# Patient Record
Sex: Male | Born: 1990 | Race: White | Hispanic: No | Marital: Single | Smoking: Current every day smoker
Health system: Southern US, Community
[De-identification: ages and names within clinical notes are randomized; demographics above are authoritative.]

## PROBLEM LIST (undated history)

## (undated) DIAGNOSIS — F1910 Other psychoactive substance abuse, uncomplicated: Secondary | ICD-10-CM

## (undated) DIAGNOSIS — R17 Unspecified jaundice: Secondary | ICD-10-CM

## (undated) DIAGNOSIS — R599 Enlarged lymph nodes, unspecified: Secondary | ICD-10-CM

## (undated) HISTORY — DX: Enlarged lymph nodes, unspecified: R59.9

## (undated) HISTORY — PX: TONSILLECTOMY: SUR1361

---
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
## 2012-08-23 DIAGNOSIS — R599 Enlarged lymph nodes, unspecified: Secondary | ICD-10-CM

## 2012-08-23 HISTORY — DX: Enlarged lymph nodes, unspecified: R59.9

## 2012-10-31 ENCOUNTER — Ambulatory Visit: Payer: Self-pay | Admitting: Family Medicine

## 2013-02-06 ENCOUNTER — Ambulatory Visit (INDEPENDENT_AMBULATORY_CARE_PROVIDER_SITE_OTHER): Payer: BC Managed Care – PPO | Admitting: General Surgery

## 2013-02-06 ENCOUNTER — Encounter: Payer: Self-pay | Admitting: General Surgery

## 2013-02-06 VITALS — BP 120/58 | HR 80 | Resp 12 | Ht 70.0 in | Wt 153.0 lb

## 2013-02-06 DIAGNOSIS — R599 Enlarged lymph nodes, unspecified: Secondary | ICD-10-CM

## 2013-02-06 NOTE — Progress Notes (Signed)
Patient ID: Joel [name], male   DOB: [birth year], 22 y.o.   MRN: [id number]  Chief Complaint  Patient presents with  . Other    evaluation of left groin lymph node    HPI Joel [name] is a 22 y.o. male here for an evaluation of left groin lymph node. He states he has two lumps bilaterally. He states he noticed this approximately three  months ago. He first noted 2 lumps in left groin. These have not resolved. Has completed a course of Augmentin and a course of Doxycycline. Denies any night sweats, fever, fatigue. HPI  Past Medical History  Diagnosis Date  . Enlarged lymph node 2014    Past Surgical History  Procedure Laterality Date  . Tonsillectomy      History reviewed. No pertinent family history.  Social History History  Substance Use Topics  . Smoking status: Current Every Day Smoker -- 1.00 packs/day for 4 years  . Smokeless tobacco: Never Used  . Alcohol Use: Yes    No Known Allergies  Current Outpatient Prescriptions  Medication Sig Dispense Refill  . amoxicillin-clavulanate (AUGMENTIN) 875-125 MG per tablet Take 1 tablet by mouth 2 (two) times daily.      . diclofenac (VOLTAREN) 75 MG EC tablet Take 75 mg by mouth 2 (two) times daily.      Marland Kitchen doxycycline (VIBRA-TABS) 100 MG tablet Take 1 tablet by mouth 2 (two) times daily.      . meloxicam (MOBIC) 15 MG tablet Take 1 tablet by mouth daily.       No current facility-administered medications for this visit.    Review of Systems Review of Systems  Constitutional: Negative.   Respiratory: Negative.   Cardiovascular: Negative.     Blood pressure 120/58, pulse 80, resp. rate 12, height 5\' 10"  (1.778 m), weight 153 lb (69.4 kg).  Physical Exam Physical Exam  Constitutional: He is oriented to person, place, and time. He appears well-developed and well-nourished.  Eyes: Conjunctivae are normal. No scleral icterus.  Neck: Trachea normal. No mass and no thyromegaly present.   Cardiovascular: Normal rate, regular rhythm, normal heart sounds and normal pulses.   No murmur heard. Pulmonary/Chest: Effort normal and breath sounds normal.  Abdominal: Soft. Normal appearance and bowel sounds are normal. There is no hepatosplenomegaly. There is no tenderness. No hernia.  Lymphadenopathy:    He has no cervical adenopathy.    He has axillary adenopathy.       Right axillary: No pectoral and no lateral adenopathy present.       Left axillary: Pectoral adenopathy present. No lateral adenopathy present.      Left: Inguinal adenopathy present.  Tiny mobile nodes in left axilla.  1.5 cm firm rubbery mobile node left groin.  Few shotty nodes on right groin.   Neurological: He is alert and oriented to person, place, and time.  Skin: Skin is warm and dry.    Data Reviewed none  Assessment    Persistent left groin node, firm and rubbery feel.      Plan    Excision with immediate path assessment for touch prep and possible flow studies. Explained fully to pt and he is agreeable.        Joel [name] G 02/06/2013, 6:36 PM

## 2013-02-06 NOTE — Patient Instructions (Addendum)
Patient to return for left groin lymph node excision.

## 2013-02-12 ENCOUNTER — Encounter: Payer: Self-pay | Admitting: General Surgery

## 2013-02-12 ENCOUNTER — Ambulatory Visit (INDEPENDENT_AMBULATORY_CARE_PROVIDER_SITE_OTHER): Payer: BC Managed Care – PPO | Admitting: General Surgery

## 2013-02-12 VITALS — BP 116/70 | HR 78 | Resp 14 | Ht 71.0 in | Wt 148.0 lb

## 2013-02-12 DIAGNOSIS — R599 Enlarged lymph nodes, unspecified: Secondary | ICD-10-CM

## 2013-02-12 NOTE — Patient Instructions (Signed)
Advised on wound care. F/U based on path report. If benign he can return here prn.

## 2013-02-12 NOTE — Progress Notes (Signed)
Patient ID: Joel [name], male   DOB: 1990/12/20, 22 y.o.   MRN: [id number]  Chief Complaint  Patient presents with  . Procedure    left groin node    HPI Joel [name] is a 22 y.o. male. here toda for an excision left groin node. HPI  Past Medical History  Diagnosis Date  . Enlarged lymph node 2014    Past Surgical History  Procedure Laterality Date  . Tonsillectomy      No family history on file.  Social History History  Substance Use Topics  . Smoking status: Current Every Day Smoker -- 1.00 packs/day for 4 years  . Smokeless tobacco: Never Used  . Alcohol Use: Yes    No Known Allergies  Current Outpatient Prescriptions  Medication Sig Dispense Refill  . amoxicillin-clavulanate (AUGMENTIN) 875-125 MG per tablet Take 1 tablet by mouth 2 (two) times daily.      . diclofenac (VOLTAREN) 75 MG EC tablet Take 75 mg by mouth 2 (two) times daily.      Marland Kitchen doxycycline (VIBRA-TABS) 100 MG tablet Take 1 tablet by mouth 2 (two) times daily.      . meloxicam (MOBIC) 15 MG tablet Take 1 tablet by mouth daily.       No current facility-administered medications for this visit.    Review of Systems Review of Systems  Constitutional: Negative.   Respiratory: Negative.   Cardiovascular: Negative.     There were no vitals taken for this visit.  Physical Exam Physical Exam  Data Reviewed none  Assessment    Left groin lymph node enlargement     Plan    Excision completed.     Procedure: Excision left groin node. Anesthetic: 1%xylocaine mixed with 0.5%marcaine 5ml.  Prep: chloroprep.  aincision made transversely over palpable node on left groin after prep. Dissected down and a 1.3cm node was excised in full. Pedicle ligated with 3-0 vicryl. Bleeding points cauterised. Wound closed with 3-0 vicryl and 4-0 vicryl. steristrips applied. Telfa and tegaderm. No immediate problems from procedure. Specimen sent to pathology fresh.   Ples Specter 02/12/2013, 8:17 AM

## 2013-02-14 ENCOUNTER — Telehealth: Payer: Self-pay | Admitting: *Deleted

## 2013-02-14 NOTE — Telephone Encounter (Signed)
Notified patient as instructed, patient pleased. Discussed follow-up appointments as needed basis, patient agrees

## 2013-02-19 LAB — PATHOLOGY

## 2013-04-03 LAB — COMP PANEL: LEUKEMIA/LYMPHOMA

## 2013-06-21 ENCOUNTER — Emergency Department: Payer: Self-pay | Admitting: Emergency Medicine

## 2013-06-21 LAB — CBC
HGB: 15.7 g/dL (ref 13.0–18.0)
MCH: 30 pg (ref 26.0–34.0)
MCHC: 34.2 g/dL (ref 32.0–36.0)
WBC: 9.6 10*3/uL (ref 3.8–10.6)

## 2013-06-21 LAB — COMPREHENSIVE METABOLIC PANEL
BUN: 13 mg/dL (ref 7–18)
Bilirubin,Total: 1.7 mg/dL — ABNORMAL HIGH (ref 0.2–1.0)
Co2: 29 mmol/L (ref 21–32)
Creatinine: 0.93 mg/dL (ref 0.60–1.30)
EGFR (African American): >60
EGFR (Non-African Amer.): >60
Glucose: 86 mg/dL (ref 65–99)
Osmolality: 273 (ref 275–301)
Potassium: 4.1 mmol/L (ref 3.5–5.1)
SGOT(AST): 23 U/L (ref 15–37)
SGPT (ALT): 21 U/L (ref 12–78)
Sodium: 137 mmol/L (ref 136–145)
Total Protein: 7.2 g/dL (ref 6.4–8.2)

## 2013-06-21 LAB — SALICYLATE LEVEL: Salicylates, Serum: <1.7 mg/dL

## 2013-06-21 LAB — TSH: Thyroid Stimulating Horm: 0.24 u[IU]/mL — ABNORMAL LOW

## 2013-06-21 LAB — ETHANOL: Ethanol %: <0.003 % (ref 0.000–0.080)

## 2013-06-22 LAB — DRUG SCREEN, URINE
Amphetamines, Ur Screen: NEGATIVE (ref ?–1000)
Barbiturates, Ur Screen: NEGATIVE (ref ?–200)
Benzodiazepine, Ur Scrn: POSITIVE (ref ?–200)
Cocaine Metabolite,Ur ~~LOC~~: NEGATIVE (ref ?–300)
Opiate, Ur Screen: NEGATIVE (ref ?–300)
Phencyclidine (PCP) Ur S: NEGATIVE (ref ?–25)
Tricyclic, Ur Screen: NEGATIVE (ref ?–1000)

## 2013-06-22 LAB — URINALYSIS, COMPLETE
Bilirubin,UR: NEGATIVE
Ph: 7 (ref 4.5–8.0)
RBC,UR: <1 /HPF (ref 0–5)

## 2013-06-28 ENCOUNTER — Other Ambulatory Visit: Payer: Self-pay

## 2013-07-16 ENCOUNTER — Ambulatory Visit: Payer: Self-pay | Admitting: Psychiatry

## 2013-07-23 ENCOUNTER — Ambulatory Visit: Payer: Self-pay | Admitting: Psychiatry

## 2013-08-23 ENCOUNTER — Ambulatory Visit: Payer: Self-pay | Admitting: Psychiatry

## 2013-09-05 LAB — DRUG SCREEN, URINE
Amphetamines, Ur Screen: NEGATIVE (ref ?–1000)
Barbiturates, Ur Screen: NEGATIVE (ref ?–200)
Benzodiazepine, Ur Scrn: NEGATIVE (ref ?–200)
CANNABINOID 50 NG, UR ~~LOC~~: NEGATIVE (ref ?–50)
Cocaine Metabolite,Ur ~~LOC~~: NEGATIVE (ref ?–300)
MDMA (ECSTASY) UR SCREEN: NEGATIVE (ref ?–500)
Methadone, Ur Screen: NEGATIVE (ref ?–300)
OPIATE, UR SCREEN: NEGATIVE (ref ?–300)
Phencyclidine (PCP) Ur S: NEGATIVE (ref ?–25)
TRICYCLIC, UR SCREEN: NEGATIVE (ref ?–1000)

## 2013-09-23 ENCOUNTER — Ambulatory Visit: Payer: Self-pay | Admitting: Psychiatry

## 2014-05-02 ENCOUNTER — Emergency Department: Payer: Self-pay | Admitting: Emergency Medicine

## 2014-05-02 LAB — COMPREHENSIVE METABOLIC PANEL
ALT: 25 U/L
AST: 21 U/L (ref 15–37)
Albumin: 4.3 g/dL (ref 3.4–5.0)
Alkaline Phosphatase: 99 U/L
Anion Gap: 8 (ref 7–16)
BUN: 13 mg/dL (ref 7–18)
Bilirubin,Total: 0.6 mg/dL (ref 0.2–1.0)
CREATININE: 0.96 mg/dL (ref 0.60–1.30)
Calcium, Total: 8.6 mg/dL (ref 8.5–10.1)
Chloride: 103 mmol/L (ref 98–107)
Co2: 28 mmol/L (ref 21–32)
EGFR (African American): >60
EGFR (Non-African Amer.): >60
GLUCOSE: 127 mg/dL — AB (ref 65–99)
OSMOLALITY: 279 (ref 275–301)
Potassium: 3.5 mmol/L (ref 3.5–5.1)
Sodium: 139 mmol/L (ref 136–145)
Total Protein: 7.5 g/dL (ref 6.4–8.2)

## 2014-05-02 LAB — CBC
HCT: 43.6 % (ref 40.0–52.0)
HGB: 14.5 g/dL (ref 13.0–18.0)
MCH: 29.8 pg (ref 26.0–34.0)
MCHC: 33.2 g/dL (ref 32.0–36.0)
MCV: 90 fL (ref 80–100)
Platelet: 293 10*3/uL (ref 150–440)
RBC: 4.86 10*6/uL (ref 4.40–5.90)
RDW: 12.7 % (ref 11.5–14.5)
WBC: 17.1 10*3/uL — AB (ref 3.8–10.6)

## 2014-05-02 LAB — URINALYSIS, COMPLETE
BILIRUBIN, UR: NEGATIVE
Bacteria: NONE SEEN
Blood: NEGATIVE
Glucose,UR: NEGATIVE mg/dL (ref 0–75)
Ketone: NEGATIVE
Nitrite: NEGATIVE
PROTEIN: NEGATIVE
Ph: 6 (ref 4.5–8.0)
RBC, UR: NONE SEEN /HPF (ref 0–5)
Specific Gravity: 1.009 (ref 1.003–1.030)
Squamous Epithelial: NONE SEEN

## 2014-05-02 LAB — DRUG SCREEN, URINE
AMPHETAMINES, UR SCREEN: NEGATIVE (ref ?–1000)
Barbiturates, Ur Screen: NEGATIVE (ref ?–200)
Benzodiazepine, Ur Scrn: NEGATIVE (ref ?–200)
CANNABINOID 50 NG, UR ~~LOC~~: NEGATIVE (ref ?–50)
Cocaine Metabolite,Ur ~~LOC~~: NEGATIVE (ref ?–300)
MDMA (Ecstasy)Ur Screen: NEGATIVE (ref ?–500)
Methadone, Ur Screen: NEGATIVE (ref ?–300)
Opiate, Ur Screen: POSITIVE (ref ?–300)
Phencyclidine (PCP) Ur S: NEGATIVE (ref ?–25)
Tricyclic, Ur Screen: NEGATIVE (ref ?–1000)

## 2014-05-02 LAB — ACETAMINOPHEN LEVEL: Acetaminophen: <2 ug/mL

## 2014-05-02 LAB — SALICYLATE LEVEL

## 2014-05-02 LAB — ETHANOL

## 2014-08-07 ENCOUNTER — Emergency Department: Payer: Self-pay | Admitting: Emergency Medicine
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
## 2014-12-13 NOTE — Consult Note (Signed)
Consult: treatment recommendations Patient was seen as requested by Staff. He was oriented to the [facility] CD-IOP however, he indicated plans to seek assistance to help with abstinence of all addictive substances of abuse usage thru the services of a residential center and that upon his return to his home he would like to participate in the CD-IOP. He believes that he could benefit from CD-IOP. Plans are to follow CD-IOP at his residential treatment discharge.    Electronic Signatures: Huel Cotehomas, Richard (PsyD)  (Signed on 04-Nov-14 17:15)  Authored  Last Updated: 04-Nov-14 17:15 by Huel Cotehomas, Richard (PsyD)

## 2014-12-13 NOTE — Consult Note (Signed)
Brief Consult Note: Diagnosis: Polysubstance dependence, substance induced mood disorder.   Patient was seen by consultant.   Consult note dictated.   Recommend further assessment or treatment.   Orders entered.   Comments: [name] has a h/o benzodiazepine and opioid dependence here after a suicide threat in the context of substance use. he is on Librium taper for benzodiazepine dependence. There are no symptoms of opioid withdrawal.    PLAN: 1. He is on IVC.  2. Referreal to [facility] completed.  3. Will continue Librium taper to avoid symptoms of benzodiazepine withdrawal. Symptomatic treatment for opiod withdrawal will be offered.  4. Psychiatry will follow.  Electronic Signatures: Joel [name], Joel [name] (MD)  (Signed [id number]-Nov-14 20:31)  Authored: Brief Consult Note   Last Updated: 03-Nov-14 20:31 by Joel [name], Joel [name] (MD)

## 2014-12-13 NOTE — Consult Note (Signed)
Brief Consult Note: Diagnosis: Polysubstance dependence, substance induced mood disorder.   Patient was seen by consultant.   Consult note dictated.   Recommend further assessment or treatment.   Orders entered.   Comments: Mr. Malen GauzeFoster has a h/o benzodiazepine and opioid dependence here after a suicide threat in the context of substance use.   PLAN: 1. He is on IVC.  2. Referreal to [facility] completed.  3. Will start low dose Librium to avoid symptoms of benzodiazepine withdrawal. Symptomatic treatment for opiod withdrawal will be offered.  4. Psychiatry will follow.  Electronic Signatures: Kristine LineaPucilowska, Myiah Petkus (MD)  (Signed 31-Oct-14 19:34)  Authored: Brief Consult Note   Last Updated: 31-Oct-14 19:34 by Kristine LineaPucilowska, Marzell Isakson (MD)

## 2014-12-14 NOTE — Consult Note (Signed)
PATIENT NAME:  Joel [name], Joel [name] MR#:  [id number] DATE OF BIRTH:  Sep 01, 1990  DATE OF CONSULTATION:  05/02/2014  CONSULTING PHYSICIAN: Audery Amel, M.D.   IDENTIFYING INFORMATION AND REASON FOR CONSULT: This is a 24 year old man who presented voluntarily to the Emergency Room.   CHIEF COMPLAINT: "I relapsed."   HISTORY OF PRESENT ILLNESS: Information obtained from the patient and the chart. The patient states that he relapsed using intravenous heroin last night. Prior to that he had been clean for about a week and a half. He only started using heroin about a month or so ago. Prior to that he has had an opiate problem for a little over year. He has been able to maintain some sobriety in the past for up to a couple of months. He denies that he is abusing other drugs. Mood has overall been pretty good. Not feeling depressed. Sleeps okay. Denies any suicidal ideation. Denies any psychotic symptoms. Not currently taking any medication. Has support from his family and good insight. Not currently having significant withdrawal symptoms.   PAST PSYCHIATRIC HISTORY: No treatment for depression that he knows of although he says that he has been prescribed citalopram, I am not sure what that is for. No history of suicide attempts, no history of psychosis. Says he has never been in a psychiatric hospital for anything except substance abuse. He has been to [facility] once before and found it helpful, stayed sober for a couple of months afterwards.   SUBSTANCE ABUSE HISTORY: Just started abusing opiates about a year ago. Used oral opiates at first, but now is using IV heroin. Rarely uses any other drugs. He drinks very really, has not developed a problem that reports with any other drugs.   FAMILY HISTORY: Father with substance abuse problems.   SOCIAL HISTORY: The patient works in Designer, fashion/clothing. He lives with his parents. Says they are very supportive and are helpful to him.   REVIEW OF SYSTEMS: Denies pain, nausea,  cardiac symptoms pulmonary symptoms. No depression, no suicidal ideation, no hallucinations. Full review of systems negative.   MENTAL STATUS EXAMINATION: Neatly groomed young man who looks his stated age, cooperative with the interview. Good eye contact. Normal psychomotor activity. Speech normal rate, tone, and volume. Affect euthymic, reactive, appropriate. Mood stated as good. Thoughts lucid. No loosening of associations or delusions. Denies auditory or visual hallucinations. Denies suicidal or homicidal ideation. Shows good insight and judgment. Alert and oriented x 4. Remembers 3 out of 3 objects immediately and at 3 minutes.   LABORATORY RESULTS: His chemistry panel shows an elevated glucose at 127 on a non-fasting draw. The drug screen is positive for opiates. Hematology panel, elevated white count 17.1, otherwise unremarkable. Urinalysis unremarkable.   VITAL SIGNS: Blood pressure most recently 117/59, respirations 18, pulse 81, temperature 97.4.   ASSESSMENT: A 24 year old man with opiate dependence. Not having significant withdrawal symptoms. No suicidal ideation, no psychosis, good insight into his condition. He already has a plan to go see an outpatient substance abuse provider tomorrow. At this point there is no indication for further hospital level treatment.   TREATMENT PLAN: He can be released from the Emergency Room. The patient has been counseled about available resources and the possibility of future use of Suboxone discussed. He is encouraged to go home to his parent's house, do not use any drugs, get some rest and tomorrow go to a substance abuse counselor appointment he already has.   DIAGNOSIS PRINCIPAL AND PRIMARY:   AXIS I:  Opiate dependence.   SECONDARY DIAGNOSES:   AXIS I: No further.   AXIS II: No diagnosis.   AXIS III: No diagnosis.    ____________________________ Audery AmelJohn T. Kamalani Mastro, MD jtc:bu D: 05/02/2014 16:23:05 ET T: 05/02/2014 16:55:15  ET JOB#: [id number]  cc: Audery AmelJohn T. Dameisha Tschida, MD, <Dictator> Audery AmelJOHN T Pearlene Teat MD ELECTRONICALLY SIGNED 05/03/2014 17:03

## 2014-12-14 NOTE — Consult Note (Signed)
Brief Consult Note: Diagnosis: opiate dependence.   Patient was seen by consultant.   Consult note dictated.   Discussed with Attending MD.   Comments: Psychiatry: Patient seen and chart reviewed and note dictated. Patient with opiate dep relapsed yesterday. Currently no SI and no psychosis. No need for inpatient treatment. Going to outpt ttreatment tomorrow. RElease from ER.  Electronic Signatures: Alezander Dimaano, Jackquline DenmarkJohn T (MD)  (Signed 10-Sep-15 16:18)  Authored: Brief Consult Note   Last Updated: 10-Sep-15 16:18 by Audery Amellapacs, Andreia Gandolfi T (MD)

## 2014-12-16 ENCOUNTER — Emergency Department (HOSPITAL_COMMUNITY)
Admission: EM | Admit: 2014-12-16 | Discharge: 2014-12-17 | Disposition: A | Discharge status: Home / Self Care | Payer: BLUE CROSS/BLUE SHIELD | Attending: Emergency Medicine | Admitting: Emergency Medicine

## 2014-12-16 ENCOUNTER — Encounter (HOSPITAL_COMMUNITY): Payer: Self-pay | Admitting: Emergency Medicine

## 2014-12-16 DIAGNOSIS — F1410 Cocaine abuse, uncomplicated: Secondary | ICD-10-CM | POA: Insufficient documentation

## 2014-12-16 DIAGNOSIS — Z79891 Long term (current) use of opiate analgesic: Secondary | ICD-10-CM | POA: Insufficient documentation

## 2014-12-16 DIAGNOSIS — Z79899 Other long term (current) drug therapy: Secondary | ICD-10-CM | POA: Diagnosis not present

## 2014-12-16 DIAGNOSIS — F13129 Sedative, hypnotic or anxiolytic abuse with intoxication, unspecified: Secondary | ICD-10-CM | POA: Diagnosis not present

## 2014-12-16 DIAGNOSIS — F12129 Cannabis abuse with intoxication, unspecified: Secondary | ICD-10-CM | POA: Insufficient documentation

## 2014-12-16 DIAGNOSIS — Z720 Tobacco use: Secondary | ICD-10-CM | POA: Diagnosis not present

## 2014-12-16 DIAGNOSIS — F1110 Opioid abuse, uncomplicated: Secondary | ICD-10-CM

## 2014-12-16 DIAGNOSIS — F11120 Opioid abuse with intoxication, uncomplicated: Secondary | ICD-10-CM | POA: Insufficient documentation

## 2014-12-16 LAB — CBC WITH DIFFERENTIAL/PLATELET
BASOS PCT: 0 % (ref 0–1)
Basophils Absolute: 0 10*3/uL (ref 0.0–0.1)
EOS PCT: 1 % (ref 0–5)
Eosinophils Absolute: 0.2 10*3/uL (ref 0.0–0.7)
HCT: 41.2 % (ref 39.0–52.0)
Hemoglobin: 14.3 g/dL (ref 13.0–17.0)
LYMPHS ABS: 2.9 10*3/uL (ref 0.7–4.0)
LYMPHS PCT: 21 % (ref 12–46)
MCH: 30.2 pg (ref 26.0–34.0)
MCHC: 34.7 g/dL (ref 30.0–36.0)
MCV: 87.1 fL (ref 78.0–100.0)
Monocytes Absolute: 1 10*3/uL (ref 0.1–1.0)
Monocytes Relative: 8 % (ref 3–12)
Neutro Abs: 9.5 10*3/uL — ABNORMAL HIGH (ref 1.7–7.7)
Neutrophils Relative %: 70 % (ref 43–77)
Platelets: 293 10*3/uL (ref 150–400)
RBC: 4.73 MIL/uL (ref 4.22–5.81)
RDW: 12.8 % (ref 11.5–15.5)
WBC: 13.6 10*3/uL — ABNORMAL HIGH (ref 4.0–10.5)

## 2014-12-16 LAB — BASIC METABOLIC PANEL
Anion gap: 12 (ref 5–15)
BUN: 12 mg/dL (ref 6–23)
CO2: 26 mmol/L (ref 19–32)
CREATININE: 1.09 mg/dL (ref 0.50–1.35)
Calcium: 9 mg/dL (ref 8.4–10.5)
Chloride: 100 mmol/L (ref 96–112)
GFR calc Af Amer: >90 mL/min (ref >90)
Glucose, Bld: 116 mg/dL — ABNORMAL HIGH (ref 70–99)
Potassium: 3.6 mmol/L (ref 3.5–5.1)
SODIUM: 138 mmol/L (ref 135–145)

## 2014-12-16 NOTE — ED Notes (Signed)
Pt's name called Joel [name].

## 2014-12-16 NOTE — ED Notes (Signed)
Pt reports he never left the waiting room, discharge undone.

## 2014-12-16 NOTE — ED Provider Notes (Signed)
CSN: [id number]     Arrival date & time 12/16/14  1646 History   First MD Initiated Contact with Patient 12/16/14 2242     Chief Complaint  Patient presents with  . Drug Problem     (Consider location/radiation/quality/duration/timing/severity/associated sxs/prior Treatment) HPI Joel [name] is a 24 y.o. male with no medical problems, presents to ED requesting detox. Pt states he has been using opiates all his life. He has been to detox a year ago but relapsed approximately 6 months ago. He takes pills and does heroin. Last [name] heroin was about 2 hours ago. He states he tried to stop on his own but gets bad withdrawal symptoms. He is requesting inpatient detox and inpatient rehabilitation. He denies any SI or HI. He does not drink alcohol. Admits to cocaine and marijuana use. States he uses pens as sometimes as well. Denies any medical problems or complaints.    Past Medical History  Diagnosis Date  . Enlarged lymph node 2014   Past Surgical History  Procedure Laterality Date  . Tonsillectomy     History reviewed. No pertinent family history. History  Substance Use Topics  . Smoking status: Current Every Day Smoker -- 1.00 packs/day for 4 years    Types: Cigarettes  . Smokeless tobacco: Never Used  . Alcohol Use: Yes    Review of Systems  Constitutional: Negative for fever and chills.  Respiratory: Negative for cough, chest tightness and shortness of breath.   Cardiovascular: Negative for chest pain, palpitations and leg swelling.  Gastrointestinal: Negative for nausea, vomiting, abdominal pain, diarrhea and abdominal distention.  Musculoskeletal: Negative for myalgias, neck pain and neck stiffness.  Skin: Negative for rash.  Allergic/Immunologic: Negative for immunocompromised state.  Neurological: Negative for dizziness, weakness, light-headedness, numbness and headaches.      Allergies  Poison ivy extract and Zoloft  Home Medications   Prior to  Admission medications   Medication Sig Start Date End Date Taking? Authorizing Provider  buprenorphine-naloxone (SUBOXONE) 8-2 MG SUBL SL tablet Place 1 tablet under the tongue daily.   Yes Historical Provider, MD  citalopram (CELEXA) 40 MG tablet Take 40 mg by mouth daily.   Yes Historical Provider, MD   BP 112/68 mmHg  Pulse 82  Temp(Src) 98.3 F (36.8 C) (Oral)  Resp 16  Ht  (1.778 m)  Wt 159 lb 9 oz (72.377 kg)  BMI 22.89 kg/m2  SpO2 96% Physical Exam  Constitutional: He is oriented to person, place, and time. He appears well-developed and well-nourished.  Appears under influence of substances  HENT:  Head: Normocephalic and atraumatic.  Eyes: Conjunctivae are normal.  Neck: Neck supple.  Cardiovascular: Normal rate, regular rhythm and normal heart sounds.   Pulmonary/Chest: Effort normal. No respiratory distress. He has no wheezes. He has no rales.  Abdominal: Soft. Bowel sounds are normal. He exhibits no distension. There is no tenderness. There is no rebound.  Musculoskeletal: He exhibits no edema.  Neurological: He is alert and oriented to person, place, and time.  Skin: Skin is warm and dry.  Psychiatric:  No SI or HI. Appears to be intoxicated  Nursing note and vitals reviewed.   ED Course  Procedures (including critical care time) Labs Review Labs Reviewed  CBC WITH DIFFERENTIAL/PLATELET - Abnormal; Notable for the following:    WBC 13.6 (*)    Neutro Abs 9.5 (*)    All other components within normal limits  BASIC METABOLIC PANEL - Abnormal; Notable for the following:  Glucose, Bld 116 (*)    All other components within normal limits  URINE RAPID DRUG SCREEN (HOSP PERFORMED) - Abnormal; Notable for the following:    Opiates POSITIVE (*)    Cocaine POSITIVE (*)    Benzodiazepines POSITIVE (*)    Tetrahydrocannabinol POSITIVE (*)    All other components within normal limits  ETHANOL    Imaging Review No results found.   EKG  Interpretation None      MDM   Final diagnoses:  Opioid abuse    Patient is here for opiate detox and rehabilitation. He has no other complaints. He is otherwise healthy. Denies SI or HI. He is actively intoxicated last used 2 hours ago. Given this finding along with the fact that we do not offer inpatient treatments for opiate addiction anymore from emergency department, will medically clear him, will have him follow-up outpatient. Resource guide provided.  Patient is medically cleared. He will need to call for the resource given and follow-up. I've given him prescription for Bentyl, Imodium, clonidine. His girlfriend does not use any drugs and he will stay with her tonight.  Filed Vitals:   12/16/14 1735 12/16/14 2303  BP: 112/68 127/72  Pulse: 82 97  Temp: 98.3 F (36.8 C)   TempSrc: Oral   Resp: 16 20  Height: 5\' 10"  (1.778 m)   Weight: 159 lb 9 oz (72.377 kg)   SpO2: 96% 96%      Jaynie Crumbleatyana Mirna Sutcliffe, PA-C 12/17/14 0040  Mancel BaleElliott Wentz, MD 12/17/14 2351

## 2014-12-16 NOTE — ED Notes (Signed)
PA at bedside.

## 2014-12-16 NOTE — ED Notes (Signed)
Pt sts "thats how my stomach aches when I haven't used"

## 2014-12-16 NOTE — ED Notes (Signed)
Called pt to reassess vital signs with no answer.  

## 2014-12-16 NOTE — ED Notes (Signed)
Pt sts his last use of IV heroin was 2-3 hours ago

## 2014-12-16 NOTE — ED Notes (Signed)
Here for detox heroin and opiates. Denies SI or HI. Requesting inpatient treatment. Last used heroin IV 1600 today 1g. Alert answering and following commands appropriate.

## 2014-12-17 LAB — RAPID URINE DRUG SCREEN, HOSP PERFORMED
Amphetamines: NOT DETECTED
Barbiturates: NOT DETECTED
Benzodiazepines: POSITIVE — AB
Cocaine: POSITIVE — AB
OPIATES: POSITIVE — AB
Tetrahydrocannabinol: POSITIVE — AB

## 2014-12-17 LAB — ETHANOL: Alcohol, Ethyl (B): <5 mg/dL (ref 0–9)

## 2014-12-17 MED ORDER — LOPERAMIDE HCL 2 MG PO CAPS: 2.0000 mg | ORAL_CAPSULE | Freq: Four times a day (QID) | ORAL | Status: DC | PRN

## 2014-12-17 MED ORDER — DICYCLOMINE HCL 10 MG PO CAPS
10.0000 mg | ORAL_CAPSULE | Freq: Once | ORAL | Status: AC
  Administered 2014-12-17: 10 mg via ORAL
  Filled 2014-12-17: qty 1

## 2014-12-17 MED ORDER — LOPERAMIDE HCL 2 MG PO CAPS
2.0000 mg | ORAL_CAPSULE | ORAL | Status: DC | PRN
  Administered 2014-12-17: 2 mg via ORAL
  Filled 2014-12-17: qty 1

## 2014-12-17 MED ORDER — DICYCLOMINE HCL 20 MG PO TABS: 20.0000 mg | ORAL_TABLET | Freq: Two times a day (BID) | ORAL | Status: DC

## 2014-12-17 MED ORDER — CLONIDINE HCL 0.1 MG PO TABS: 0.1000 mg | ORAL_TABLET | Freq: Two times a day (BID) | ORAL | Status: DC

## 2014-12-17 MED ORDER — CLONIDINE HCL 0.1 MG PO TABS
0.1000 mg | ORAL_TABLET | Freq: Once | ORAL | Status: AC
  Administered 2014-12-17: 0.1 mg via ORAL
  Filled 2014-12-17: qty 1

## 2014-12-17 NOTE — Discharge Instructions (Signed)
Take bentyl for abdominal cramping. Imodium for diarrhea. Ibuprofen or tylenol for abdominal pain. Clonidine to help with withdrawal. Follow up from resources provided. You have been medically cleared today.    Opioid Use Disorder Opioid use disorder is a mental disorder. It is the continued nonmedical use of opioids in spite of risks to health and well-being. Misused opioids include the street drug heroin. They also include pain medicines such as morphine, hydrocodone, oxycodone, and fentanyl. Opioids are very addictive. People who misuse opioids get an exaggerated feeling of well-being. Opioid use disorder often disrupts activities at home, work, or school. It may cause mental or physical problems.  A family history of opioid use disorder puts you at higher risk of it. People with opioid use disorder often misuse other drugs or have mental illness such as depression, posttraumatic stress disorder, or antisocial personality disorder. They also are at risk of suicide and death from overdose. SIGNS AND SYMPTOMS  Signs and symptoms of opioid use disorder include:  Use of opioids in larger amounts or over a longer period than intended.  Unsuccessful attempts to cut down or control opioid use.  A lot of time spent obtaining, using, or recovering from the effects of opioids.  A strong desire or urge to use opioids (craving).  Continued use of opioids in spite of major problems at work, school, or home because of use.  Continued use of opioids in spite of relationship problems because of use.  Giving up or cutting down on important life activities because of opioid use.  Use of opioids over and over in situations when it is physically hazardous, such as driving a car.  Continued use of opioids in spite of a physical problem that is likely related to use. Physical problems can include:  Severe constipation.  Poor nutrition.  Infertility.  Tuberculosis.  Aspiration pneumonia.  Infections  such as human immunodeficiency virus (HIV) and hepatitis (from injecting opioids).  Continued use of opioids in spite of a mental problem that is likely related to use. Mental problems can include:  Depression.  Anxiety.  Hallucinations.  Sleep problems.  Loss of sexual function.  Need to use more and more opioids to get the same effect, or lessened effect over time with use of the same amount (tolerance).  Having withdrawal symptoms when opioid use is stopped, or using opioids to reduce or avoid withdrawal symptoms. Withdrawal symptoms include:  Depressed, anxious, or irritable mood.  Nausea, vomiting, diarrhea, or intestinal cramping.  Muscle aches or spasms.  Excessive tearing or runny nose.  Dilated pupils, sweating, or hairs standing on end.  Yawning.  Fever, raised blood pressure, or fast pulse.  Restlessness or trouble sleeping. This does not apply to people taking opioids for medical reasons only. DIAGNOSIS Opioid use disorder is diagnosed by your health care provider. You may be asked questions about your opioid use and and how it affects your life. A physical exam may be done. A drug screen may be ordered. You may be referred to a mental health professional. The diagnosis of opioid use disorder requires at least two symptoms within 12 months. The type of opioid use disorder you have depends on the number of signs and symptoms you have. The type may be:  Mild. Two or three signs and symptoms.   Moderate. Four or five signs and symptoms.   Severe. Six or more signs and symptoms. TREATMENT  Treatment is usually provided by mental health professionals with training in substance use disorders.The following  options are available:  Detoxification.This is the first step in treatment for withdrawal. It is medically supervised withdrawal with the use of medicines. These medicines lessen withdrawal symptoms. They also raise the chance of becoming opioid  free.  Counseling, also known as talk therapy. Talk therapy addresses the reasons you use opioids. It also addresses ways to keep you from using again (relapse). The goals of talk therapy are to avoid relapse by:  Identifying and avoiding triggers for use.  Finding healthy ways to cope with stress.  Learning how to handle cravings.  Support groups. Support groups provide emotional support, advice, and guidance.  A medicine that blocks opioid receptors in your brain. This medicine can reduce opioid cravings that lead to relapse. This medicine also blocks the desired opioid effect when relapse occurs.  Opioids that are taken by mouth in place of the misused opioid (opioid maintenance treatment). These medicines satisfy cravings but are safer than commonly misused opioids. This often is the best option for people who continue to relapse with other treatments. HOME CARE INSTRUCTIONS   Take medicines only as directed by your health care provider.  Check with your health care provider before starting new medicines.  Keep all follow-up visits as directed by your health care provider. SEEK MEDICAL CARE IF:  You are not able to take your medicines as directed.  Your symptoms get worse. SEEK IMMEDIATE MEDICAL CARE IF:  You have serious thoughts about hurting yourself or others.  You may have taken an overdose of opioids. FOR MORE INFORMATION  National Institute on Drug Abuse: http://www.price-smith.com/  Substance Abuse and Mental Health Services Administration: SkateOasis.com.pt Document Released: 06/06/2007 Document Revised: 12/24/2013 Document Reviewed: 08/22/2013 [facility] Patient Information 2015 [place], [place]. This information is not intended to replace advice given to you by your health care provider. Make sure you discuss any questions you have with your health care provider.  Opioid Withdrawal Opioids are a group of narcotic drugs. They include the street drug heroin. They also include  pain medicines, such as morphine, hydrocodone, oxycodone, and fentanyl. Opioid withdrawal is a group of characteristic physical and mental signs and symptoms. It typically occurs if you have been using opioids daily for several weeks or longer and stop using or rapidly decrease use. Opioid withdrawal can also occur if you have used opioids daily for a long time and are given a medicine to block the effect.  SIGNS AND SYMPTOMS Opioid withdrawal includes three or more of the following symptoms:   Depressed, anxious, or irritable mood.  Nausea or vomiting.  Muscle aches or spasms.   Watery eyes.   Runny nose.  Dilated pupils, sweating, or hairs standing on end.  Diarrhea or intestinal cramping.  Yawning.   Fever.  Increased blood pressure.  Fast pulse.  Restlessness or trouble sleeping. These signs and symptoms occur within several hours of stopping or reducing short-acting opioids, such as heroin. They can occur within 3 days of stopping or reducing long-acting opioids, such as methadone. Withdrawal begins within minutes of receiving a drug that blocks the effects of opioids, such as naltrexone or naloxone. DIAGNOSIS  Opioid use disorder is diagnosed by your health care provider. You will be asked about your symptoms, drug and alcohol use, medical history, and use of medicines. A physical exam may be done. Lab tests may be ordered. Your health care provider may have you see a mental health professional.  TREATMENT  The treatment for opioid withdrawal is usually provided by medical doctors with special  training in substance use disorders (addiction specialists). The following medicines may be included in treatment:  Opioids given in place of the abused opioid. They turn on opioid receptors in the brain and lessen or prevent withdrawal symptoms. They are gradually decreased (opioid substitution and taper).  Non-opioids that can lessen certain opioid withdrawal symptoms. They may be  used alone or with opioid substitution and taper. Successful long-term recovery usually requires medicine, counseling, and group support. HOME CARE INSTRUCTIONS   Take medicines only as directed by your health care provider.  Check with your health care provider before starting new medicines.  Keep all follow-up visits as directed by your health care provider. SEEK MEDICAL CARE IF:  You are not able to take your medicines as directed.  Your symptoms get worse.  You relapse. SEEK IMMEDIATE MEDICAL CARE IF:  You have serious thoughts about hurting yourself or others.  You have a seizure.  You lose consciousness. Document Released: 08/12/2003 Document Revised: 12/24/2013 Document Reviewed: 08/22/2013 [facility] Patient Information 2015 [place], [place]. This information is not intended to replace advice given to you by your health care provider. Make sure you discuss any questions you have with your health care provider.   Emergency Department Resource Guide 1) Find a Doctor and Pay Out of Pocket Although you won't have to find out who is covered by your insurance plan, it is a good idea to ask around and get recommendations. You will then need to call the office and see if the doctor you have chosen will accept you as a new patient and what types of options they offer for patients who are self-pay. Some doctors offer discounts or will set up payment plans for their patients who do not have insurance, but you will need to ask so you aren't surprised when you get to your appointment.  2) Contact Your Local Health Department Not all health departments have doctors that can see patients for sick visits, but many do, so it is worth a call to see if yours does. If you don't know where your local health department is, you can check in your phone book. The CDC also has a tool to help you locate your state's health department, and many state websites also have listings of all of their local health  departments.  3) Find a Walk-in Clinic If your illness is not likely to be very severe or complicated, you may want to try a walk in clinic. These are popping up all over the country in pharmacies, drugstores, and shopping centers. They're usually staffed by nurse practitioners or physician assistants that have been trained to treat common illnesses and complaints. They're usually fairly quick and inexpensive. However, if you have serious medical issues or chronic medical problems, these are probably not your best option.  No Primary Care Doctor: - Call Health Connect at  [phone] - they can help you locate a primary care doctor that  accepts your insurance, provides certain services, etc. - Physician Referral Service- [phone]  Chronic Pain Problems: Organization         Address  Phone   Notes  [facility]  [phone] Patients need to be referred by their primary care doctor.   Medication Assistance: Organization         Address  Phone   Notes  [facility] Medication [facility] [address] [place], [place] [address] [phone] --Must be a resident of [facility] -- Must have NO  insurance coverage whatsoever (no Medicaid/ Medicare, etc.) -- The pt. MUST have a primary care doctor that directs their care regularly and follows them in the community   MedAssist  [phone]   [employer]  [phone]    Agencies that provide inexpensive medical care: Organization         Address  Phone   Notes  [facility]  [phone]) [phone]   [facility]    [phone](336) [phone]   [facility] [address] [place], [place] [id number] [phone]) [phone]   [facility] [address], [place] [phone]   Planned Parenthood    [phone]   [facility]    [phone]   [facility] and [facility]  [address], [place] Phone:  [phone](336)  [phone], Fax:  [phone]) [phone] Hours of Operation:  9 am - 6 pm, M-F.  Also accepts Medicaid/Medicare and self-pay.  [facility]  [address], [place] Phone: [phone](336) [phone], Fax: [phone]. Hours of Operation:  8:30 am - 5:30 pm, M-F.  Also accepts Medicaid and self-pay.  [facility] [place] [address], [place] Phone: [phone]) [phone]   [facility] [address], [place], [place] [phone], Ext. [phone] Mondays & Thursdays: 7-9 AM.  First 15 patients are seen on a first come, first serve basis.    Medicaid-accepting [facility] Providers:  Organization         Address  Phone   Notes  [facility] [address], [place] [phone]) [phone] Also accepts self-pay patients.  [facility] [address] [place], [place]  [phone]   [facility] [address], [place] [phone]) [phone]   [facility] [address] [place] [address], [place] [phone](336) [phone]   Renaye RakersVeita Bland [address], [place]   [phone] Only accepts [place] Access [place] patients after they have their name applied to their card.   Self-Pay (no insurance) in [facility]:  Organization         Address  Phone   Notes  Sickle Cell Patients, [facility] [address], [place] [phone]) [phone]   [facility] [address], [place] [phone]) [phone]   [facility] [place]  [address], [place] [phone]   [facility]/Dr. Osei-Bonsu  [address] [place] [address], [place] or [phone], [place] [phone]) [phone] Phone number for both [place] and [place] locations is the same.  [facility] [address], [place] [phone](336) [phone]   [facility] [address] [place] [address], [place] or [address] [phone])  [phone]   [facility] [address], [place] [phone](336) [phone], phone; [phone]) [phone], fax Sees patients 1st and 3rd Saturday of every month.  Must not qualify for public or private insurance (i.e. Medicaid, Medicare, [place] Health Choice, Veterans' Benefits)  Household income should be no more than 200% of the poverty level The clinic cannot treat you if you are pregnant or think you are pregnant  Sexually transmitted diseases are not treated at the clinic.    Dental Care: Organization         Address  Phone  Notes  [facility] [place] [facility]  Department of Public Health [facility] [place] [address] [place], [place] [phone] Accepts children up to age 28 who are enrolled in [place] or [place] Health Choice; pregnant women with a Medicaid card; and children who have applied for Medicaid or [place] Health Choice, but were declined, whose parents can pay a reduced fee at time of service.  [facility] of [facility]  [address], [place] [phone] Accepts children up to age 50 who are enrolled in [place] or [place] Health Choice; pregnant women with a Medicaid card; and children who have applied for Medicaid or [place] Health Choice, but were declined, whose parents can pay a reduced fee at time of service.  [facility]  [address] [place], [place] [phone] Patients are seen by appointment only. Walk-ins are not accepted. [facility] will see patients 92 years of age and older. Monday - Tuesday (8am-5pm) Most Wednesdays (8:30-5pm) $30 per visit, cash only  [facility] Adult [facility] Access PRO[facility]  [address], [facility] [phone] Patients are seen by appointment only. Walk-ins are not accepted. [facility] will see patients 6 years of age and older. One Wednesday Evening (Monthly: Volunteer Based).  $30 per visit, cash only  [employer]  [phone] for adults;  Children under age 48, call Graduate Pediatric Dentistry at [phone]. Children aged 54-14, please call [phone] to request a pediatric application.  Dental services are provided in all areas of dental care including fillings, crowns and bridges, complete and partial dentures, implants, gum treatment, root canals, and extractions. Preventive care is also provided. Treatment is provided to both adults and children. Patients are selected via a lottery and there is often a waiting list.   [facility] [address], [place]  [phone] [email or web address]   [facility] [address] [place], [place] [phone], Ext. [phone] Second and Fourth Thursday of each month, opens at 6:30 AM; Clinic ends at 9 AM.  Patients are seen on a first-come first-served basis, and a limited number are seen during each clinic.   [facility]  [address] [place], [place] [phone]   Eligibility Requirements You must have lived in [place], [place], or [place] counties for at least the last three months.   You cannot be eligible for state or federal sponsored [place], including [employer], [place], or [employer].   You generally cannot be eligible for healthcare insurance through your employer.    How to apply: Eligibility screenings are held every Tuesday and Wednesday afternoon from 1:00 pm until 4:00 pm. You do not need an appointment for the interview!  [facility] [address] [place], [place] [phone]   [facility] Health Department  [phone]   [facility] Health Department  [phone]   [facility] Health Department  [phone]    Behavioral Health Resources in the Community: Intensive Outpatient Programs Organization         Address  Phone  Notes  [facility] [address], [place], [place] [phone]   [facility] Outpatient [address] [place], [place] [phone]   ADS: [facility] Drug S[facility] [address] [place], [place]  [phone]   [facility] Mental Health [address]  [place], [place] [phone] or [phone]   Substance Abuse Resources Organization  Address  Phone  Notes  Alcohol and Drug Services  [phone]   [place]  [phone]   [facility] [place]  [phone]   Chinita Pester  [phone]   Residential & Outpatient Substance Abuse Program  [phone]   Psychological Services Organization         Address  Phone  Notes  [facility] [place]  [place]  [phone]   [place] [address], [place] or [phone]    Mobile Crisis Teams Organization         Address  Phone  Notes  [facility], Mobile Crisis Care Unit  [phone]   [facility]  [address] [place], [place]   [place] Levels [address], [place] [phone]    Self-Help/Support Groups Organization         Address  Phone             Notes  [place]. of [place] - variety of support groups  [place] Call for more information  Narcotics Anonymous (NA), [facility] [address], [employer] [place]  2 meetings at this location   Special educational needs teacher         Address  Phone  Notes  [facility] [place],    [place]  [phone]   [facility]  [address], [place] [id number], [place], [place]   [place], [place] [phone] Admissions: 8am-3pm M-F  Incentives Substance [place] [address]    [place], [place] [phone]   [facility] [address] [place], [place], [place]   [facility] [address]  [place], [place]   [facility] Programs - Intensive  [address]patient [address], [place], [place]   [facility].,  [place], [place] [phone] or [phone]   [facility] (RTS) [address], [place], [place] Accepts Medicaid  [facility] [place] [address],  [place] [phone] Substance Abuse/Addiction Treatment   [facility] Organization         Address  Phone  Notes  [facility]  [phone]   Domenic Schwab, PhD [address] [place], [place]   [phone] or [phone]   [place] [place], [place] [phone]   [facility] [address], [place], [place] [phone] Insurance/Medicaid/sponsorship through [facility] and Families [address], [address] [place]                                    [place], [place] [phone] [place] [address], [place] [phone]    Dr. Adele Schilder  [phone]   [facility] Dept. 1) [address] [place] 2) [place] 3)  [place] [address], [place] [phone]  [phone]   [place] [phone] or [phone] (After Hours)

## 2016-01-14 ENCOUNTER — Encounter (HOSPITAL_COMMUNITY): Payer: Self-pay

## 2016-01-14 ENCOUNTER — Emergency Department (HOSPITAL_COMMUNITY)
Admission: EM | Admit: 2016-01-14 | Discharge: 2016-01-15 | Disposition: A | Discharge status: Home / Self Care | Payer: BLUE CROSS/BLUE SHIELD | Attending: Emergency Medicine | Admitting: Emergency Medicine

## 2016-01-14 DIAGNOSIS — F1210 Cannabis abuse, uncomplicated: Secondary | ICD-10-CM | POA: Insufficient documentation

## 2016-01-14 DIAGNOSIS — F1920 Other psychoactive substance dependence, uncomplicated: Secondary | ICD-10-CM

## 2016-01-14 DIAGNOSIS — Y998 Other external cause status: Secondary | ICD-10-CM | POA: Insufficient documentation

## 2016-01-14 DIAGNOSIS — F1410 Cocaine abuse, uncomplicated: Secondary | ICD-10-CM | POA: Insufficient documentation

## 2016-01-14 DIAGNOSIS — Z79899 Other long term (current) drug therapy: Secondary | ICD-10-CM | POA: Insufficient documentation

## 2016-01-14 DIAGNOSIS — F17210 Nicotine dependence, cigarettes, uncomplicated: Secondary | ICD-10-CM | POA: Insufficient documentation

## 2016-01-14 DIAGNOSIS — F1320 Sedative, hypnotic or anxiolytic dependence, uncomplicated: Secondary | ICD-10-CM | POA: Insufficient documentation

## 2016-01-14 DIAGNOSIS — X58XXXA Exposure to other specified factors, initial encounter: Secondary | ICD-10-CM | POA: Insufficient documentation

## 2016-01-14 DIAGNOSIS — Y9389 Activity, other specified: Secondary | ICD-10-CM | POA: Insufficient documentation

## 2016-01-14 DIAGNOSIS — Y9289 Other specified places as the place of occurrence of the external cause: Secondary | ICD-10-CM | POA: Insufficient documentation

## 2016-01-14 DIAGNOSIS — F1110 Opioid abuse, uncomplicated: Secondary | ICD-10-CM | POA: Insufficient documentation

## 2016-01-14 LAB — COMPREHENSIVE METABOLIC PANEL
ALT: 13 U/L — AB (ref 17–63)
AST: 18 U/L (ref 15–41)
Albumin: 4.2 g/dL (ref 3.5–5.0)
Alkaline Phosphatase: 87 U/L (ref 38–126)
Anion gap: 6 (ref 5–15)
BILIRUBIN TOTAL: 0.8 mg/dL (ref 0.3–1.2)
BUN: 9 mg/dL (ref 6–20)
CALCIUM: 9.5 mg/dL (ref 8.9–10.3)
CO2: 30 mmol/L (ref 22–32)
CREATININE: 1.15 mg/dL (ref 0.61–1.24)
Chloride: 101 mmol/L (ref 101–111)
GFR calc Af Amer: >60 mL/min (ref >60)
Glucose, Bld: 93 mg/dL (ref 65–99)
Potassium: 3.8 mmol/L (ref 3.5–5.1)
Sodium: 137 mmol/L (ref 135–145)
TOTAL PROTEIN: 6.8 g/dL (ref 6.5–8.1)

## 2016-01-14 LAB — DIFFERENTIAL
Basophils Absolute: 0.1 10*3/uL (ref 0.0–0.1)
Basophils Relative: 0 %
Eosinophils Absolute: 0.3 10*3/uL (ref 0.0–0.7)
Eosinophils Relative: 3 %
LYMPHS PCT: 32 %
Lymphs Abs: 3.6 10*3/uL (ref 0.7–4.0)
Monocytes Absolute: 1 10*3/uL (ref 0.1–1.0)
Monocytes Relative: 9 %
NEUTROS ABS: 6.3 10*3/uL (ref 1.7–7.7)
NEUTROS PCT: 56 %

## 2016-01-14 LAB — RAPID URINE DRUG SCREEN, HOSP PERFORMED
Amphetamines: NOT DETECTED
BARBITURATES: NOT DETECTED
Benzodiazepines: POSITIVE — AB
COCAINE: POSITIVE — AB
OPIATES: POSITIVE — AB
Tetrahydrocannabinol: POSITIVE — AB

## 2016-01-14 LAB — CBC
HCT: 40.9 % (ref 39.0–52.0)
Hemoglobin: 13.8 g/dL (ref 13.0–17.0)
MCH: 29.2 pg (ref 26.0–34.0)
MCHC: 33.7 g/dL (ref 30.0–36.0)
MCV: 86.5 fL (ref 78.0–100.0)
PLATELETS: 326 10*3/uL (ref 150–400)
RBC: 4.73 MIL/uL (ref 4.22–5.81)
RDW: 12.8 % (ref 11.5–15.5)
WBC: 11.2 10*3/uL — ABNORMAL HIGH (ref 4.0–10.5)

## 2016-01-14 LAB — URINALYSIS, ROUTINE W REFLEX MICROSCOPIC
Glucose, UA: NEGATIVE mg/dL
Hgb urine dipstick: NEGATIVE
Ketones, ur: 15 mg/dL — AB
LEUKOCYTES UA: NEGATIVE
Nitrite: NEGATIVE
PROTEIN: NEGATIVE mg/dL
Specific Gravity, Urine: 1.03 (ref 1.005–1.030)
pH: 5.5 (ref 5.0–8.0)

## 2016-01-14 LAB — SALICYLATE LEVEL: Salicylate Lvl: <4 mg/dL (ref 2.8–30.0)

## 2016-01-14 LAB — ACETAMINOPHEN LEVEL

## 2016-01-14 LAB — TROPONIN I

## 2016-01-14 LAB — ETHANOL

## 2016-01-14 MED ORDER — LORAZEPAM 2 MG/ML IJ SOLN: 0.0000 mg | Freq: Two times a day (BID) | INTRAMUSCULAR | Status: DC

## 2016-01-14 MED ORDER — SODIUM CHLORIDE 0.9 % IV BOLUS (SEPSIS)
1000.0000 mL | Freq: Once | INTRAVENOUS | Status: AC
  Administered 2016-01-14: 1000 mL via INTRAVENOUS

## 2016-01-14 MED ORDER — NICOTINE 14 MG/24HR TD PT24
14.0000 mg | MEDICATED_PATCH | Freq: Once | TRANSDERMAL | Status: DC
  Administered 2016-01-14: 14 mg via TRANSDERMAL
  Filled 2016-01-14: qty 1

## 2016-01-14 MED ORDER — LORAZEPAM 2 MG/ML IJ SOLN: 0.0000 mg | Freq: Four times a day (QID) | INTRAMUSCULAR | Status: DC

## 2016-01-14 NOTE — ED Provider Notes (Signed)
CSN: [id number]     Arrival date & time 01/14/16  1438 History   First MD Initiated Contact with Patient 01/14/16 1528     Chief Complaint  Patient presents with  . Drug Overdose    (Consider location/radiation/quality/duration/timing/severity/associated sxs/prior Treatment) Patient is a 25 y.o. male presenting with Overdose. The history is provided by the patient, medical records and a friend. No language interpreter was used.  Drug Overdose Pertinent negatives include no abdominal pain, chest pain, coughing, diaphoresis, fever, headaches, myalgias, nausea or vomiting.   Parvin Stetzer [name] is a 25 y.o. male  with no pertinent PMH who presents to the Emergency Department after drug overdose. Patient states over the last 5-6 days he went on a "binge of drugs" Patient states today he had 1 gram heroin, 10 xanax bars, a couple beers, and possibly cocaine but he is not sure. Denies chest pain, shortness of breath, abdominal pain, n/v. Friend at bedside state they called [facility] to try to get into a rehab facility and they said he was to acute at this time and needed to come to ED. Denies SI/HI, auditory/visual hallucinations.    Past Medical History  Diagnosis Date  . Enlarged lymph node 2014   Past Surgical History  Procedure Laterality Date  . Tonsillectomy     No family history on file. Social History  Substance Use Topics  . Smoking status: Current Every Day Smoker -- 1.00 packs/day for 4 years    Types: Cigarettes  . Smokeless tobacco: Never Used  . Alcohol Use: Yes    Review of Systems  Constitutional: Negative for fever and diaphoresis.  HENT: Negative for trouble swallowing.   Eyes: Negative for visual disturbance.  Respiratory: Negative for cough and shortness of breath.   Cardiovascular: Negative for chest pain and palpitations.  Gastrointestinal: Negative for nausea, vomiting and abdominal pain.  Musculoskeletal: Negative for myalgias.  Skin: Negative for color  change.  Neurological: Negative for dizziness and headaches.  Psychiatric/Behavioral: Negative for suicidal ideas.     Allergies  Poison ivy extract and Zoloft  Home Medications   Prior to Admission medications   Medication Sig Start Date End Date Taking? Authorizing Provider  buprenorphine-naloxone (SUBOXONE) 8-2 MG SUBL SL tablet Place 1 tablet under the tongue daily.    Historical Provider, MD  citalopram (CELEXA) 40 MG tablet Take 40 mg by mouth daily.    Historical Provider, MD  cloNIDine (CATAPRES) 0.1 MG tablet Take 1 tablet (0.1 mg total) by mouth 2 (two) times daily. 12/17/14   Tatyana Kirichenko, PA-C  dicyclomine (BENTYL) 20 MG tablet Take 1 tablet (20 mg total) by mouth 2 (two) times daily. 12/17/14   Tatyana Kirichenko, PA-C  loperamide (IMODIUM) 2 MG capsule Take 1 capsule (2 mg total) by mouth 4 (four) times daily as needed for diarrhea or loose stools. 12/17/14   Tatyana Kirichenko, PA-C   BP 104/63 mmHg  Pulse 73  Temp(Src) 98.9 F (37.2 C) (Oral)  Resp 16  SpO2 96% Physical Exam  Constitutional: He is oriented to person, place, and time. He appears well-developed and well-nourished.  Somnolent, NAD.   HENT:  Head: Normocephalic and atraumatic.  Oropharynx clear, tacky mucus membranes.   Eyes:  Dilated pupils.   Cardiovascular: Normal rate, regular rhythm, normal heart sounds and intact distal pulses.  Exam reveals no gallop and no friction rub.   No murmur heard. Pulmonary/Chest: Effort normal and breath sounds normal. No respiratory distress. He has no wheezes. He has no  rales. He exhibits no tenderness.  RR of 15, 96% on RA. Equal chest expansion.   Abdominal: Soft. Bowel sounds are normal. He exhibits no distension and no mass. There is no tenderness. There is no rebound and no guarding.  Musculoskeletal: Normal range of motion.  Neurological: He is alert and oriented to person, place, and time. No cranial nerve deficit.  A&Ox3, Slurred speech. Able to follow  commands and answer questions appropriately.   Skin: Skin is warm and dry.  Nursing note and vitals reviewed.   ED Course  Procedures (including critical care time) Labs Review Labs Reviewed  COMPREHENSIVE METABOLIC PANEL - Abnormal; Notable for the following:    ALT 13 (*)    All other components within normal limits  CBC - Abnormal; Notable for the following:    WBC 11.2 (*)    All other components within normal limits  URINE RAPID DRUG SCREEN, HOSP PERFORMED - Abnormal; Notable for the following:    Opiates POSITIVE (*)    Cocaine POSITIVE (*)    Benzodiazepines POSITIVE (*)    Tetrahydrocannabinol POSITIVE (*)    All other components within normal limits  ACETAMINOPHEN LEVEL - Abnormal; Notable for the following:    Acetaminophen (Tylenol), Serum <10 (*)    All other components within normal limits  URINALYSIS, ROUTINE W REFLEX MICROSCOPIC (NOT AT [facility]) - Abnormal; Notable for the following:    Color, Urine AMBER (*)    Bilirubin Urine SMALL (*)    Ketones, ur 15 (*)    All other components within normal limits  ETHANOL  SALICYLATE LEVEL  DIFFERENTIAL  TROPONIN I    Imaging Review No results found. I have personally reviewed and evaluated these images and lab results as part of my medical decision-making.   EKG Interpretation   Date/Time:  Wednesday Jan 14 2016 15:42:20 EDT Ventricular Rate:  90 PR Interval:  145 QRS Duration: 91 QT Interval:  351 QTC Calculation: 429 R Axis:   99 Text Interpretation:  Sinus rhythm Probable left atrial enlargement  Borderline right axis deviation No previous ECGs available Confirmed by  LITTLE MD, RACHEL [phone]) on 01/14/2016 4:05:26 PM      MDM   Final diagnoses:  Drug abuse and dependence (HCC)   Joel [name] presents to ED for drug overdose of multiple different substances including alcohol, xanax, heroin, and possibly cocaine. On exam, patient has some mild but arousable. He is answering questions  appropriately and following commands. Airway is patent and he is not having any difficulty breathing. Respiration rate is 15 and 96% on room air. CIWA protocol in place.   Labs: CBC, CMP, ETOH, salicylate, acetaminophen, Troponin, UA reviewed and reassuring. UDS + for opiates, cocaine, benzo's, and THC EKG reviewed.  Therapeutics: [name] Fluids  Patient re-evaluated and much improved from initial presentation. Speaking clearly, ambulating without difficulty. [facility] who informed him to come in the morning for inpatient treatment program after being medically cleared. All labs and vitals again reviewed. Patient medically cleared. He states that he has no place to go until morning when [facility] - housing will not let him back because of + UDS.   Spoke with charge nurse who states [facility]. CIWA protocol still in place. Patient to be discharged in am [facility].   [facility] HospitalJaime Pilcher Cristan Scherzer, PA-C 01/15/16 0046  Laurence Spatesachel Morgan Little, MD 01/15/16 [phone]

## 2016-01-14 NOTE — Progress Notes (Signed)
This writer completed a chart review.   Veverly Larimer, MSW, LCSW, LCAS [facility] Triage Specialist [phone] 

## 2016-01-14 NOTE — ED Notes (Signed)
PA at bedside.

## 2016-01-14 NOTE — ED Notes (Signed)
Pt. Is here for detox. Pt. Is very lethargic, and altered lOC. Pt. Has taken 150mg  of Roxycodone, injecdted 1 gram of Heroine, taken  15mg  of Xanax and has taken 5 grams of Cocaine over 6 days.  He also had had 2 beers. Airway is intact.

## 2016-01-15 NOTE — Discharge Instructions (Signed)
Community Resource Guide Outpatient Counseling/Substance Abuse Adult °The [employer]’s “211” is a great source of information about community services available.  Access by dialing 2-1-1 from anywhere in [place], or by website -  www.nc211.org.  ° °Other Local Resources (Updated 08/2015) ° °Crisis Hotlines °  °Services  ° °  °Area Served  °[facility] • Crisis Hotline, available 24 hours a day, 7 days a week: 800-939-5911 [place], [place]  ° [facility] • Crisis Hotline, available 24 hours a day, 7 days a week: 866-275-9552 [place], [place]  °[facility] • Suicide Prevention Hotline, available 24 hours a day, 7 days a week: 800-273-8255 [place], [place]  °[facility] ° • Crisis Hotline, available 24 hours a day, 7 days a week: 336-676-6840 [place], [place] °  °[facility] • Crisis Hotline, available 24 hours a day, 7 days a week: 800-256-2452 All °  °[facility] • Crisis Hotline, available 24 hours a day, 7 days a week: 877-626-1772 All  ° °Other Local Resources (Updated 08/2015) ° °Outpatient Counseling/ Substance Abuse Programs  °Services  ° °  °Address and Phone Number  °[facility] ° • Options include Individual counseling, group counseling, intensive outpatient program (several hours a day, several days a week) °• Offers depression assessments °• Provides methadone maintenance program 336-333-6860 °[address] °[place], [place] [address] °  °[facility] ° • Offers partial hospitalization/day treatment and DUI/DWI programs °• Accepts Medicare, private insurance 336-299-4655 °[address] °[place], [place] [address]  °[facility] ° ° • Services include intensive outpatient program (several hours a day, several days a week), outpatient treatment, DUI/DWI services, family education °• Also has some services specifically for Veterans °• Offers transitional housing   [phone] °[address] °[place], [place] [address] °  °  °[facility] • Accepts Medicare, private pay, and private insurance 336-272-0855 °[address] °[place], [place] [address]  °[facility] • Services include individual counseling, substance abuse intensive outpatient program (several hours a day, several days a week), day treatment °• Accepts Medicare, Medicaid, private insurance 336-271-5888 °[address] °[place], [place] [address]  °[facility] ° • Offers substance abuse intensive outpatient program (several hours a day, several days a week), partial hospitalization program 336-832-9800 °[address] °[place], [place] [address] ° °336-349-4454 °[address] °[place], [place] [address] ° °[phone] °[address] °[place], [place] [address] ° °[phone] °[address] °[place], [place] [address]  °[facility] • Individual counseling only °• Accepts private insurance only 336-292-1510 °[address] °[place], [place] [address]  °[facility] • Methadone maintenance program 800-805-6989 °[address] °, [place] [address]  °[facility] • Walk-In Clinic providing substance abuse and mental health counseling °• Accepts Medicaid, Medicare, private insurance °• Offers sliding scale for uninsured 336-342-8316 °[address] °[place], [place]   °[facility] • Offers individual counseling, and intensive in-home services 336-347-7415 °[address] °[place], [place] [address]  °[facility] • Offers individual counseling, family counseling, group therapy, domestic violence counseling, consumer credit counseling °• Accepts Medicare, Medicaid, private insurance °• Offers sliding scale for uninsured 336-387-6161 °[address] °, [place] [address] ° °336-889-6161 °[place], [address] °[place],  [address]  °[facility] • Offers individual, family   and group counseling °• 3 locations - [place], [place], and [place] ° 336-899-8800 ° °[address] °[place], [place] [address] ° °[address] °[place], [place] [address] ° °[address] °[place], [place] [address]  °[facility]  ° • Offers psychiatric assessment, 8-week Intensive Outpatient Program (several hours a day, several times a week, daytime or evenings), early recovery group, family Program, medication management °• Private pay or private insurance only 336 -[phone], or  °800-659-3381 °[address] °[place], [place] [address]  °[facility] • Offers individual, couples and family counseling °• Accepts Medicaid, private insurance, and sliding scale for uninsured 336-542-2076 °[address] °[place], [place] [address]  °David Fuller, MD • Individual counseling °• Private insurance 336-852-4051 °[address] °[place], [place] [address]  °[facility] ° • Offers assessment, substance abuse treatment, and behavioral health treatment [phone] °[address] °[place], [place] [address]  °[facility] • Individual counseling °• Accepts private insurance 336-272-1972 °[address] °[place], [place] [address]  °[facility] • Individual counseling °• Accepts Medicare, private insurance [phone] °[address] °[place], [place] [address]  °[facility]  ° • Offers intensive outpatient program (several hours a day, several times a week) °• Private pay, private insurance [phone] °[address] °[place], [place]  °[facility] • Individual counseling °• Medicare, private insurance [phone] °[address] °[place], [place] [address]  °[facility]  ° • Offers intensive outpatient program (several hours a day, several times a week) and partial hospitalization  program 336-794-3550 °[address] °[place], [place] [address]  °Parrish McKinney, MD • Individual counseling 336-282-1251 °[address] °[place], [place] [address]  °[facility] • Offers Christian counseling to individuals, couples, and families °• Accepts Medicare and private insurance; offers sliding scale for uninsured 336-288-1484 °[address] °[place], [place] [address]  °[facility] • Christian counseling 336-542-2060 °[address] °[place], [place] [address]  °[facility] ° • Offers crisis counseling, individual counseling, group therapy, in-home therapy, domestic violence services, day treatment, DWI services, Community Support Team (CST), Assertive Community Treatment Team (ACTT), substance abuse Intensive Outpatient Program (several hours a day, several times a week) °• 2 locations - [place] and [place] [phone] °[address] °[place], [place] [address] ° °336-694-1777 °[address] °[place], [place] [address]  °[facility]  ° ° • Individual counseling and group therapy °• Accepts private insurance, Medicare, Medicaid 336-379-7146 °[address] °[place], [place]  °[facility] • Offers individual and family counseling °• Offers LGBTQ services °• Accepts private insurance and private pay 336-288-9190 °[address] °[place], [place] [address]  °[facility]  ° • Offers individual counseling, group therapy, and outpatient detox °• Accepts private insurance [phone] °[address] °[place], [place]  °[facility] • Individual counseling °• Accepts Medicare, private insurance [phone] °[address] °[place], [place] [address]  °[facility] • Individual counseling °• Accepts Medicare, private insurance [phone] °[address] °[place], [place] [address]  °[facility] ° • Offers substance abuse  Intensive Outpatient Program (several hours a day, several times a week) [phone], or °[phone] °[place],   ° °

## 2016-01-15 NOTE — ED Notes (Signed)
Spoke with Dr. Anitra LauthPlunkett, told her about plan for pt to be discharged and [facility]. Dr. Anitra LauthPlunkett will place orders, [facility] starts accepting pt's at 9. Pt calling ride now.

## 2016-03-16 ENCOUNTER — Telehealth: Payer: Self-pay | Admitting: Emergency Medicine

## 2016-03-16 NOTE — Telephone Encounter (Signed)
FYI.... Pt mother called, pt is back on drugs (Heroin,opiates and cocaine) he is also depressed. Pt mom talked pt into making an appointment with you. Mother says pt is very depressed, sleeps all the time and is "out of it" all the time. She found a needle under his bed this morning. He was in a half way house in the past and was doing well. Then came in contact with another guy that was on drugs and began using again. She would like you to talk to him about this at his appointment and possibly refer him to Dr. Maryruth Bun. She has a niece that is a PA and told him to try Lamictal and Zyprexa and/or Lexapro. She reports that pt had some celexa left over from previous rx but when she asked him to restart them he refused. She was on a previous release to talk to mom but she says that after this he may take her off the DPR.

## 2016-03-17 ENCOUNTER — Encounter: Payer: Self-pay | Admitting: Family Medicine

## 2016-03-17 ENCOUNTER — Ambulatory Visit (INDEPENDENT_AMBULATORY_CARE_PROVIDER_SITE_OTHER): Payer: Self-pay | Admitting: Family Medicine

## 2016-03-17 VITALS — BP 104/60 | HR 84 | Temp 98.0°F | Wt 167.0 lb

## 2016-03-17 DIAGNOSIS — F329 Major depressive disorder, single episode, unspecified: Secondary | ICD-10-CM

## 2016-03-17 DIAGNOSIS — F1920 Other psychoactive substance dependence, uncomplicated: Secondary | ICD-10-CM

## 2016-03-17 DIAGNOSIS — F32A Depression, unspecified: Secondary | ICD-10-CM

## 2016-03-17 MED ORDER — DULOXETINE HCL 30 MG PO CPEP: 30.0000 mg | ORAL_CAPSULE | ORAL | 5 refills | Status: DC

## 2016-03-17 MED ORDER — ARIPIPRAZOLE 5 MG PO TABS: 5.0000 mg | ORAL_TABLET | Freq: Every day | ORAL | 1 refills | Status: DC

## 2016-03-17 NOTE — Progress Notes (Signed)
Subjective:  HPI Depression/anxiety- Pt is here today because he is depressed. He reports that his mom wanted him to come in today to talk about his depression. He says that he sleeps all the time he gets aggravated easily about any little thing. He has tried Sertraline in the past but had a rash and Celexa but it did not seem to work. He would like to try something different today.  Pt seems very anxious while checking him into the exam room. He is accompanied by a young lady today.  Patient says he has not used drugs for a week. He is not suicidal. He does not describe any manic episodes at all in the past. He is amenable to possibly seeing psychiatry in the future.  Note from mom Pt mother called yesterday, pt is back on drugs (Heroin,opiates and cocaine) he is also depressed. Pt mom talked pt into making an appointment with you. Mother says pt is very depressed, sleeps all the time and is "out of it" all the time. She found a needle under his bed this morning. He was in a half way house in the past and was doing well. Then came in contact with another guy that was on drugs and began using again. She would like you to talk to him about this at his appointment and possibly refer him to Dr. Maryruth Bun. She has a niece that is a PA and told him to try Lamictal and Zyprexa and/or Lexapro. She reports that pt had some celexa left over from previous rx but when she asked him to restart them he refused. Mom is on HIPPA form as of last signed form.    Prior to Admission medications   Medication Sig Start Date End Date Taking? Authorizing Provider  buprenorphine-naloxone (SUBOXONE) 8-2 MG SUBL SL tablet Place 1 tablet under the tongue daily.    Historical Provider, MD  citalopram (CELEXA) 40 MG tablet Take 40 mg by mouth daily.    Historical Provider, MD  cloNIDine (CATAPRES) 0.1 MG tablet Take 1 tablet (0.1 mg total) by mouth 2 (two) times daily. 12/17/14   Tatyana Kirichenko, PA-C  dicyclomine (BENTYL)  20 MG tablet Take 1 tablet (20 mg total) by mouth 2 (two) times daily. 12/17/14   Tatyana Kirichenko, PA-C  loperamide (IMODIUM) 2 MG capsule Take 1 capsule (2 mg total) by mouth 4 (four) times daily as needed for diarrhea or loose stools. 12/17/14   Jaynie Crumble, PA-C    Patient Active Problem List   Diagnosis Date Noted  . Enlarged lymph node     Past Medical History:  Diagnosis Date  . Enlarged lymph node 2014    Social History   Social History  . Marital status: Single    Spouse name: N/A  . Number of children: N/A  . Years of education: N/A   Occupational History  . Not on file.   Social History Main Topics  . Smoking status: Current Every Day Smoker    Packs/day: 1.00    Years: 6.00    Types: Cigarettes  . Smokeless tobacco: Never Used  . Alcohol use Yes     Comment: every 3 days   . Drug use: No     Comment: denies any drug use.   Marland Kitchen Sexual activity: Not on file   Other Topics Concern  . Not on file   Social History Narrative  . No narrative on file    Allergies  Allergen Reactions  .  Poison Ivy Extract [employer] Poison Ivy]   . Zoloft [Sertraline Hcl]     Review of Systems  Constitutional: Negative.   HENT: Negative.   Eyes: Negative.   Respiratory: Negative.   Cardiovascular: Negative.   Gastrointestinal: Negative.   Genitourinary: Negative.   Musculoskeletal: Negative.   Skin: Negative.   Neurological: Negative.   Endo/Heme/Allergies: Negative.   Psychiatric/Behavioral: Positive for depression. Negative for suicidal ideas. The patient is nervous/anxious.      There is no immunization history on file for this patient. Objective:  BP 104/60   Pulse 84   Temp 98 F (36.7 C) (Oral)   Wt 167 lb (75.8 kg)   BMI 23.96 kg/m   Depression screen [facility] 2/9 03/17/2016  Decreased Interest 2  Down, Depressed, Hopeless 1  PHQ - 2 Score 3  Altered sleeping 3  Tired, decreased energy 3  Change in appetite 1  Feeling bad or failure about  yourself  1  Trouble concentrating 1  Moving slowly or fidgety/restless 1  Suicidal thoughts 0  PHQ-9 Score 13  Difficult doing work/chores Very difficult     Physical Exam  Constitutional: He is oriented to person, place, and time and well-developed, well-nourished, and in no distress.  HENT:  Head: Normocephalic and atraumatic.  Right Ear: External ear normal.  Left Ear: External ear normal.  Eyes: Conjunctivae are normal.  Neck: Neck supple.  Cardiovascular: Normal rate, regular rhythm and normal heart sounds.   Pulmonary/Chest: Effort normal and breath sounds normal.  Abdominal: Soft.  Neurological: He is alert and oriented to person, place, and time.  Skin: Skin is warm and dry.  Psychiatric: Mood, memory, affect and judgment normal.    Lab Results  Component Value Date   WBC 11.2 (H) 01/14/2016   HGB 13.8 01/14/2016   HCT 40.9 01/14/2016   PLT 326 01/14/2016   GLUCOSE 93 01/14/2016   TSH 0.24 (L) 06/21/2013    CMP     Component Value Date/Time   NA 137 01/14/2016 1532   NA 139 05/02/2014 0141   K 3.8 01/14/2016 1532   K 3.5 05/02/2014 0141   CL 101 01/14/2016 1532   CL 103 05/02/2014 0141   CO2 30 01/14/2016 1532   CO2 28 05/02/2014 0141   GLUCOSE 93 01/14/2016 1532   GLUCOSE 127 (H) 05/02/2014 0141   BUN 9 01/14/2016 1532   BUN 13 05/02/2014 0141   CREATININE 1.15 01/14/2016 1532   CREATININE 0.96 05/02/2014 0141   CALCIUM 9.5 01/14/2016 1532   CALCIUM 8.6 05/02/2014 0141   PROT 6.8 01/14/2016 1532   PROT 7.5 05/02/2014 0141   ALBUMIN 4.2 01/14/2016 1532   ALBUMIN 4.3 05/02/2014 0141   AST 18 01/14/2016 1532   AST 21 05/02/2014 0141   ALT 13 (L) 01/14/2016 1532   ALT 25 05/02/2014 0141   ALKPHOS 87 01/14/2016 1532   ALKPHOS 99 05/02/2014 0141   BILITOT 0.8 01/14/2016 1532   BILITOT 0.6 05/02/2014 0141   GFRNONAA >60 01/14/2016 1532   GFRNONAA >60 05/02/2014 0141   GFRAA >60 01/14/2016 1532   GFRAA >60 05/02/2014 0141    Assessment and  Plan :  1. Depression Start with half a Abilify tablet at night and go to 1 after 1 week. I will see him back in 1-4 weeks. Have offered psychiatric referral at any point in time. - ARIPiprazole (ABILIFY) 5 MG tablet; Take 1 tablet (5 mg total) by mouth at bedtime.  Dispense: 30 tablet; Refill:  1 - DULoxetine (CYMBALTA) 30 MG capsule; Take 1 capsule (30 mg total) by mouth every morning.  Dispense: 30 capsule; Refill: 5  2. Drug abuse and dependence (HCC) Per pt, he has been clean for 1 week.  2 months ago in the ED patient was positive for heroine, cocaine and marijuana and benzos. I told him that he will very likely die from drug use and he does not stop. I will not prescribe him any addictive medication at any point in time. I will not prescribe benzodiazepines, narcotics, stimulants.  Julieanne Manson MD [facility] 03/17/2016 11:32 AM

## 2016-03-22 ENCOUNTER — Ambulatory Visit: Payer: Self-pay | Admitting: Family Medicine

## 2016-05-17 ENCOUNTER — Telehealth: Payer: Self-pay | Admitting: Family Medicine

## 2016-05-17 NOTE — Telephone Encounter (Signed)
Advised patient's mother as below. She wanted to proceed with psychiatry referral. She also wanted to know should she just discontinue Celexa altogether? She reports that since the patient is not taking it daily like he is supposed to, could it be causing more harm than good? She reports that the patient has not taken any of his medications in 5 days (at least). She reports that today he has been sleep all day. Do you have any recommendations that may help the patient while he is waiting for his psych appt? Please advise. Thanks!

## 2016-05-17 NOTE — Telephone Encounter (Signed)
He needs to see psychiatrist. That sounds like he is binging on drugs then sleeping it off. I am sure he needs to see psychiatry ,he does have to want to get help/get better.

## 2016-05-17 NOTE — Telephone Encounter (Signed)
Joel LundJanet came by the office just now to get some advice on what to do with her son Almetta LovelyBobo.   He is staying gone for days at a time, the coming home to eat everything in the house, then immediately goes to sleep for days.   She said that she thinks the Abilify and Celexa are making him act strange.     Please call her back.  [phone]

## 2016-05-18 NOTE — Telephone Encounter (Signed)
We do  Not have authorization to talk to this patient's mother Michelle Nasutilena

## 2016-05-18 NOTE — Telephone Encounter (Signed)
Mother advised that we can not disclose any information. Michelle NasutiElena

## 2016-07-06 ENCOUNTER — Other Ambulatory Visit: Payer: Self-pay | Admitting: Family Medicine

## 2016-07-06 DIAGNOSIS — F32A Depression, unspecified: Secondary | ICD-10-CM

## 2016-07-06 DIAGNOSIS — F329 Major depressive disorder, single episode, unspecified: Secondary | ICD-10-CM

## 2017-11-02 ENCOUNTER — Ambulatory Visit: Payer: Self-pay | Admitting: Family Medicine

## 2017-11-07 ENCOUNTER — Ambulatory Visit (INDEPENDENT_AMBULATORY_CARE_PROVIDER_SITE_OTHER): Payer: BLUE CROSS/BLUE SHIELD | Admitting: Family Medicine

## 2017-11-07 ENCOUNTER — Other Ambulatory Visit: Payer: Self-pay

## 2017-11-07 VITALS — BP 126/64 | HR 92 | Temp 98.4°F | Resp 14

## 2017-11-07 DIAGNOSIS — F329 Major depressive disorder, single episode, unspecified: Secondary | ICD-10-CM

## 2017-11-07 DIAGNOSIS — F1910 Other psychoactive substance abuse, uncomplicated: Secondary | ICD-10-CM | POA: Diagnosis not present

## 2017-11-07 DIAGNOSIS — R5383 Other fatigue: Secondary | ICD-10-CM | POA: Diagnosis not present

## 2017-11-07 DIAGNOSIS — F32A Depression, unspecified: Secondary | ICD-10-CM

## 2017-11-07 MED ORDER — DULOXETINE HCL 30 MG PO CPEP: 30.0000 mg | ORAL_CAPSULE | ORAL | 1 refills | Status: DC

## 2017-11-07 MED ORDER — ARIPIPRAZOLE 5 MG PO TABS: 5.0000 mg | ORAL_TABLET | Freq: Every day | ORAL | 1 refills | Status: DC

## 2017-11-07 MED ORDER — AMANTADINE HCL 100 MG PO CAPS: 100.0000 mg | ORAL_CAPSULE | Freq: Two times a day (BID) | ORAL | 1 refills | Status: DC

## 2017-11-07 NOTE — Progress Notes (Signed)
Joel [name]  MRN: [id number] DOB: 1991-04-16  Subjective:  HPI   The patient is a 27 year old male who presents for discussion of medications.  He states [facility] at the first of the year and spent 5 days there.  While there they treated him with Amantadine to control the urge for cocaine.  Since that time he has relapse on 2 occasions with the use of cocaine.  He state that on 2 separate occasions he used about 1.5 grams of cocaine.  He denies use of any other drugs at any other time since being in [facility].  He does admit to alcohol use on average of a 12 pack per month.  Last use of cocaine was about 10 days ago and last alcoholic drink was 4 days ago. The patient would like to discuss use of Duloxetine and Abilify.  He has started back using both of these from a prescription he had left from 2017.  The patient would like to get these refilled for continued use and also would like to get a prescription for Amantadine if possible.   He also complains of feeling tired all the time and would like to discuss this with you.  Patient Active Problem List   Diagnosis Date Noted  . Enlarged lymph node     Past Medical History:  Diagnosis Date  . Enlarged lymph node 2014    Social History   Socioeconomic History  . Marital status: Single    Spouse name: Not on file  . Number of children: Not on file  . Years of education: Not on file  . Highest education level: Not on file  Social Needs  . Financial resource strain: Not on file  . Food insecurity - worry: Not on file  . Food insecurity - inability: Not on file  . Transportation needs - medical: Not on file  . Transportation needs - non-medical: Not on file  Occupational History  . Not on file  Tobacco Use  . Smoking status: Current Every Day Smoker    Packs/day: 1.00    Years: 6.00    Pack years: 6.00    Types: Cigarettes  . Smokeless tobacco: Never Used  Substance and Sexual Activity  . Alcohol  use: Yes    Comment: every 3 days   . Drug use: No    Comment: denies any drug use.   Marland Kitchen. Sexual activity: Not on file  Other Topics Concern  . Not on file  Social History Narrative  . Not on file    Outpatient Encounter Medications as of 11/07/2017  Medication Sig  . ARIPiprazole (ABILIFY) 5 MG tablet TAKE 1 TABLET BY MOUTH AT BEDTIME  . DULoxetine (CYMBALTA) 30 MG capsule Take 1 capsule (30 mg total) by mouth every morning.  . buprenorphine-naloxone (SUBOXONE) 8-2 MG SUBL SL tablet Place 1 tablet under the tongue daily.  . citalopram (CELEXA) 40 MG tablet Take 40 mg by mouth daily.  . cloNIDine (CATAPRES) 0.1 MG tablet Take 1 tablet (0.1 mg total) by mouth 2 (two) times daily. (Patient not taking: Reported on 11/07/2017)   No facility-administered encounter medications on file as of 11/07/2017.     Allergies  Allergen Reactions  . Poison Ivy Extract [Poison Ivy Extract]   . Zoloft [Sertraline Hcl]     Review of Systems  Constitutional: Positive for malaise/fatigue. Negative for fever.  Eyes: Negative.   Respiratory: Negative for cough, shortness of breath and  wheezing.   Cardiovascular: Negative for chest pain, palpitations, orthopnea, claudication and leg swelling.  Gastrointestinal: Negative.   Skin: Negative.   Neurological: Negative.  Negative for weakness.  Endo/Heme/Allergies: Negative.   Psychiatric/Behavioral: Positive for depression and substance abuse. Negative for hallucinations, memory loss and suicidal ideas. The patient is nervous/anxious. The patient does not have insomnia (Patient is oversleeping).      GAD 7 : Generalized Anxiety Score 11/07/2017  Nervous, Anxious, on Edge 2  Control/stop worrying 2  Worry too much - different things 2  Trouble relaxing 1  Restless 0  Easily annoyed or irritable 1  Afraid - awful might happen 0  Total GAD 7 Score 8  Anxiety Difficulty Somewhat difficult   Depression screen [facility] 2/9 11/07/2017 03/17/2016  Decreased Interest  3 2  Down, Depressed, Hopeless 3 1  PHQ - 2 Score 6 3  Altered sleeping 2 3  Tired, decreased energy 2 3  Change in appetite 1 1  Feeling bad or failure about yourself  1 1  Trouble concentrating 1 1  Moving slowly or fidgety/restless 1 1  Suicidal thoughts 0 0  PHQ-9 Score 14 13  Difficult doing work/chores Somewhat difficult Very difficult    Objective:  BP 126/64 (BP Location: Right Arm, Patient Position: Sitting, Cuff Size: Normal)   Pulse 92   Temp 98.4 F (36.9 C) (Oral)   Resp 14   Physical Exam  Constitutional: He is oriented to person, place, and time and well-developed, well-nourished, and in no distress.  HENT:  Head: Normocephalic and atraumatic.  Eyes: Conjunctivae are normal. No scleral icterus.  Neck: No thyromegaly present.  Cardiovascular: Normal rate and regular rhythm.  Pulmonary/Chest: Effort normal.  Abdominal: Soft.  Lymphadenopathy:    He has no cervical adenopathy.  Neurological: He is alert and oriented to person, place, and time. Gait normal. GCS score is 15.  Skin: Skin is warm and dry.  Psychiatric: Mood, memory, affect and judgment normal.    Assessment and Plan :  1. Depression Pt not suicidal.Pt must see psychiatry. Have written for 1 month with 1 rf only. - ARIPiprazole (ABILIFY) 5 MG tablet; Take 1 tablet (5 mg total) by mouth at bedtime.  Dispense: 30 tablet; Refill: 1 - DULoxetine (CYMBALTA) 30 MG capsule; Take 1 capsule (30 mg total) by mouth every morning.  Dispense: 30 capsule; Refill: 1  2. Substance abuse (HCC) Polysubstance abuse for half of his life. Recommend treatment inpatient but he declines.Will not ever prescribe any controlled substances for this pt. - amantadine (SYMMETREL) 100 MG capsule; Take 1 capsule (100 mg total) by mouth 2 (two) times daily.  Dispense: 60 capsule; Refill: 1 - ARIPiprazole (ABILIFY) 5 MG tablet; Take 1 tablet (5 mg total) by mouth at bedtime.  Dispense: 30 tablet; Refill: 1 - DULoxetine (CYMBALTA)  30 MG capsule; Take 1 capsule (30 mg total) by mouth every morning.  Dispense: 30 capsule; Refill: 1 - Ambulatory referral to Psychiatry - CBC with Differential/Platelet - Hepatitis B surface antibody - Hepatitis C antibody - HIV antibody (with reflex)  3. Other fatigue  - CBC with Differential/Platelet - Comprehensive metabolic panel - TSH  I have done the exam and reviewed the chart and it is accurate to the best of my knowledge. Dentist has been used and  any errors in dictation or transcription are unintentional. Julieanne Manson M.D. [facility]

## 2017-11-08 LAB — COMPREHENSIVE METABOLIC PANEL
A/G RATIO: 2.3 — AB (ref 1.2–2.2)
ALK PHOS: 81 IU/L (ref 39–117)
ALT: 12 IU/L (ref 0–44)
AST: 15 IU/L (ref 0–40)
Albumin: 5.2 g/dL (ref 3.5–5.5)
BUN/Creatinine Ratio: 14 (ref 9–20)
BUN: 14 mg/dL (ref 6–20)
Bilirubin Total: 1 mg/dL (ref 0.0–1.2)
CO2: 20 mmol/L (ref 20–29)
Calcium: 9.8 mg/dL (ref 8.7–10.2)
Chloride: 100 mmol/L (ref 96–106)
Creatinine, Ser: 1.03 mg/dL (ref 0.76–1.27)
GFR calc Af Amer: 114 mL/min/{1.73_m2} (ref >59)
GFR calc non Af Amer: 98 mL/min/{1.73_m2} (ref >59)
Globulin, Total: 2.3 g/dL (ref 1.5–4.5)
Glucose: 76 mg/dL (ref 65–99)
POTASSIUM: 4.1 mmol/L (ref 3.5–5.2)
SODIUM: 141 mmol/L (ref 134–144)
Total Protein: 7.5 g/dL (ref 6.0–8.5)

## 2017-11-08 LAB — CBC WITH DIFFERENTIAL/PLATELET
BASOS: 0 %
Basophils Absolute: 0 10*3/uL (ref 0.0–0.2)
EOS (ABSOLUTE): 0 10*3/uL (ref 0.0–0.4)
EOS: 0 %
Hematocrit: 42.8 % (ref 37.5–51.0)
Hemoglobin: 14.6 g/dL (ref 13.0–17.7)
Immature Grans (Abs): 0 10*3/uL (ref 0.0–0.1)
Immature Granulocytes: 0 %
LYMPHS ABS: 2.3 10*3/uL (ref 0.7–3.1)
Lymphs: 19 %
MCH: 30.3 pg (ref 26.6–33.0)
MCHC: 34.1 g/dL (ref 31.5–35.7)
MCV: 89 fL (ref 79–97)
Monocytes Absolute: 0.8 10*3/uL (ref 0.1–0.9)
Monocytes: 6 %
Neutrophils Absolute: 8.8 10*3/uL — ABNORMAL HIGH (ref 1.4–7.0)
Neutrophils: 75 %
Platelets: 303 10*3/uL (ref 150–379)
RBC: 4.82 x10E6/uL (ref 4.14–5.80)
RDW: 13.9 % (ref 12.3–15.4)
WBC: 11.9 10*3/uL — ABNORMAL HIGH (ref 3.4–10.8)

## 2017-11-08 LAB — HEPATITIS B SURFACE ANTIBODY,QUALITATIVE: HEP B SURFACE AB, QUAL: REACTIVE

## 2017-11-08 LAB — TSH: TSH: 0.646 u[IU]/mL (ref 0.450–4.500)

## 2017-11-08 LAB — HEPATITIS C ANTIBODY: Hep C Virus Ab: <0.1 s/co ratio (ref 0.0–0.9)

## 2017-11-08 LAB — HIV ANTIBODY (ROUTINE TESTING W REFLEX): HIV Screen 4th Generation wRfx: NONREACTIVE

## 2018-01-23 ENCOUNTER — Other Ambulatory Visit: Payer: Self-pay

## 2018-01-23 ENCOUNTER — Telehealth: Payer: Self-pay | Admitting: Family Medicine

## 2018-01-23 NOTE — Telephone Encounter (Signed)
Pt is requesting when he had STD testing done March 2019 was he tested for herpies. Please advise. Thanks TNP

## 2018-01-23 NOTE — Telephone Encounter (Signed)
We did not do any test for this.  Patient has been advised.  Told patient I would check on any blood test that could be done.

## 2018-01-24 NOTE — Telephone Encounter (Signed)
Patient advised about blood test accuracy and instructed on culture if lesion appears.

## 2018-07-29 ENCOUNTER — Emergency Department (HOSPITAL_COMMUNITY): Payer: BLUE CROSS/BLUE SHIELD

## 2018-07-29 ENCOUNTER — Inpatient Hospital Stay (HOSPITAL_COMMUNITY)
Admission: EM | Admit: 2018-07-29 | Discharge: 2018-07-31 | DRG: 894 | Discharge status: Against Medical Advice | Payer: BLUE CROSS/BLUE SHIELD | Attending: Internal Medicine | Admitting: Internal Medicine

## 2018-07-29 ENCOUNTER — Encounter (HOSPITAL_COMMUNITY): Payer: Self-pay | Admitting: Emergency Medicine

## 2018-07-29 ENCOUNTER — Other Ambulatory Visit: Payer: Self-pay

## 2018-07-29 DIAGNOSIS — E861 Hypovolemia: Secondary | ICD-10-CM | POA: Diagnosis present

## 2018-07-29 DIAGNOSIS — R9431 Abnormal electrocardiogram [ECG] [EKG]: Secondary | ICD-10-CM

## 2018-07-29 DIAGNOSIS — F17210 Nicotine dependence, cigarettes, uncomplicated: Secondary | ICD-10-CM | POA: Diagnosis present

## 2018-07-29 DIAGNOSIS — F1123 Opioid dependence with withdrawal: Principal | ICD-10-CM | POA: Diagnosis present

## 2018-07-29 DIAGNOSIS — Z79899 Other long term (current) drug therapy: Secondary | ICD-10-CM

## 2018-07-29 DIAGNOSIS — E876 Hypokalemia: Secondary | ICD-10-CM | POA: Diagnosis present

## 2018-07-29 DIAGNOSIS — F10930 Alcohol use, unspecified with withdrawal, uncomplicated: Secondary | ICD-10-CM

## 2018-07-29 DIAGNOSIS — R112 Nausea with vomiting, unspecified: Secondary | ICD-10-CM | POA: Diagnosis not present

## 2018-07-29 DIAGNOSIS — R21 Rash and other nonspecific skin eruption: Secondary | ICD-10-CM | POA: Diagnosis not present

## 2018-07-29 DIAGNOSIS — F1910 Other psychoactive substance abuse, uncomplicated: Secondary | ICD-10-CM

## 2018-07-29 DIAGNOSIS — R17 Unspecified jaundice: Secondary | ICD-10-CM | POA: Diagnosis present

## 2018-07-29 DIAGNOSIS — G4700 Insomnia, unspecified: Secondary | ICD-10-CM | POA: Diagnosis present

## 2018-07-29 DIAGNOSIS — F10230 Alcohol dependence with withdrawal, uncomplicated: Secondary | ICD-10-CM | POA: Diagnosis present

## 2018-07-29 DIAGNOSIS — R197 Diarrhea, unspecified: Secondary | ICD-10-CM

## 2018-07-29 DIAGNOSIS — Z888 Allergy status to other drugs, medicaments and biological substances status: Secondary | ICD-10-CM

## 2018-07-29 DIAGNOSIS — F1290 Cannabis use, unspecified, uncomplicated: Secondary | ICD-10-CM | POA: Diagnosis present

## 2018-07-29 DIAGNOSIS — E871 Hypo-osmolality and hyponatremia: Secondary | ICD-10-CM

## 2018-07-29 DIAGNOSIS — F1410 Cocaine abuse, uncomplicated: Secondary | ICD-10-CM | POA: Diagnosis present

## 2018-07-29 DIAGNOSIS — Z91048 Other nonmedicinal substance allergy status: Secondary | ICD-10-CM

## 2018-07-29 HISTORY — DX: Other psychoactive substance abuse, uncomplicated: F19.10

## 2018-07-29 HISTORY — DX: Unspecified jaundice: R17

## 2018-07-29 LAB — CBC WITH DIFFERENTIAL/PLATELET
Abs Immature Granulocytes: 0.02 10*3/uL (ref 0.00–0.07)
Basophils Absolute: 0.1 10*3/uL (ref 0.0–0.1)
Basophils Relative: 1 %
Eosinophils Absolute: 0 10*3/uL (ref 0.0–0.5)
Eosinophils Relative: 0 %
HCT: 41.6 % (ref 39.0–52.0)
Hemoglobin: 14.3 g/dL (ref 13.0–17.0)
Immature Granulocytes: 0 %
LYMPHS ABS: 1.4 10*3/uL (ref 0.7–4.0)
Lymphocytes Relative: 18 %
MCH: 29.7 pg (ref 26.0–34.0)
MCHC: 34.4 g/dL (ref 30.0–36.0)
MCV: 86.3 fL (ref 80.0–100.0)
Monocytes Absolute: 0.7 10*3/uL (ref 0.1–1.0)
Monocytes Relative: 9 %
Neutro Abs: 5.7 10*3/uL (ref 1.7–7.7)
Neutrophils Relative %: 72 %
Platelets: 322 10*3/uL (ref 150–400)
RBC: 4.82 MIL/uL (ref 4.22–5.81)
RDW: 11.8 % (ref 11.5–15.5)
WBC: 7.9 10*3/uL (ref 4.0–10.5)
nRBC: 0 % (ref 0.0–0.2)

## 2018-07-29 LAB — COMPREHENSIVE METABOLIC PANEL
ALT: 23 U/L (ref 0–44)
AST: 30 U/L (ref 15–41)
Albumin: 4.5 g/dL (ref 3.5–5.0)
Alkaline Phosphatase: 59 U/L (ref 38–126)
Anion gap: 14 (ref 5–15)
BUN: 14 mg/dL (ref 6–20)
CO2: 27 mmol/L (ref 22–32)
Calcium: 8.9 mg/dL (ref 8.9–10.3)
Chloride: 88 mmol/L — ABNORMAL LOW (ref 98–111)
Creatinine, Ser: 1.02 mg/dL (ref 0.61–1.24)
GFR calc Af Amer: >60 mL/min (ref >60)
GFR calc non Af Amer: >60 mL/min (ref >60)
Glucose, Bld: 123 mg/dL — ABNORMAL HIGH (ref 70–99)
Potassium: 3.7 mmol/L (ref 3.5–5.1)
SODIUM: 129 mmol/L — AB (ref 135–145)
Total Bilirubin: 1.9 mg/dL — ABNORMAL HIGH (ref 0.3–1.2)
Total Protein: 6.6 g/dL (ref 6.5–8.1)

## 2018-07-29 LAB — URINALYSIS, ROUTINE W REFLEX MICROSCOPIC
BILIRUBIN URINE: NEGATIVE
Glucose, UA: NEGATIVE mg/dL
HGB URINE DIPSTICK: NEGATIVE
Ketones, ur: 5 mg/dL — AB
Leukocytes, UA: NEGATIVE
Nitrite: NEGATIVE
Protein, ur: NEGATIVE mg/dL
SPECIFIC GRAVITY, URINE: 1.017 (ref 1.005–1.030)
pH: 7 (ref 5.0–8.0)

## 2018-07-29 LAB — MAGNESIUM: Magnesium: 2 mg/dL (ref 1.7–2.4)

## 2018-07-29 LAB — ETHANOL: Alcohol, Ethyl (B): <10 mg/dL (ref <10)

## 2018-07-29 LAB — LIPASE, BLOOD: Lipase: 26 U/L (ref 11–51)

## 2018-07-29 LAB — PHOSPHORUS: Phosphorus: 3.1 mg/dL (ref 2.5–4.6)

## 2018-07-29 MED ORDER — ENOXAPARIN SODIUM 40 MG/0.4ML ~~LOC~~ SOLN
40.0000 mg | SUBCUTANEOUS | Status: DC
  Administered 2018-07-29 – 2018-07-30 (×2): 40 mg via SUBCUTANEOUS
  Filled 2018-07-29 (×2): qty 0.4

## 2018-07-29 MED ORDER — ONDANSETRON HCL 4 MG/2ML IJ SOLN
4.0000 mg | Freq: Once | INTRAMUSCULAR | Status: AC
  Administered 2018-07-29: 4 mg via INTRAVENOUS
  Filled 2018-07-29: qty 2

## 2018-07-29 MED ORDER — SODIUM CHLORIDE 0.9% FLUSH
3.0000 mL | Freq: Two times a day (BID) | INTRAVENOUS | Status: DC
  Administered 2018-07-29 – 2018-07-31 (×4): 3 mL via INTRAVENOUS

## 2018-07-29 MED ORDER — THIAMINE HCL 100 MG/ML IJ SOLN: 100.0000 mg | Freq: Every day | INTRAMUSCULAR | Status: DC

## 2018-07-29 MED ORDER — METHOCARBAMOL 500 MG PO TABS: 500.0000 mg | ORAL_TABLET | Freq: Three times a day (TID) | ORAL | Status: DC | PRN

## 2018-07-29 MED ORDER — THIAMINE HCL 100 MG/ML IJ SOLN
Freq: Once | INTRAVENOUS | Status: AC
  Administered 2018-07-29: 23:00:00 via INTRAVENOUS
  Filled 2018-07-29: qty 1000

## 2018-07-29 MED ORDER — LORAZEPAM 1 MG PO TABS
1.0000 mg | ORAL_TABLET | Freq: Four times a day (QID) | ORAL | Status: DC | PRN
  Administered 2018-07-31: 1 mg via ORAL
  Filled 2018-07-29: qty 1

## 2018-07-29 MED ORDER — ACETAMINOPHEN 325 MG PO TABS: 650.0000 mg | ORAL_TABLET | Freq: Four times a day (QID) | ORAL | Status: DC | PRN

## 2018-07-29 MED ORDER — ONDANSETRON 4 MG PO TBDP: 4.0000 mg | ORAL_TABLET | Freq: Four times a day (QID) | ORAL | Status: DC | PRN

## 2018-07-29 MED ORDER — CLONIDINE HCL 0.1 MG PO TABS: 0.1000 mg | ORAL_TABLET | Freq: Every day | ORAL | Status: DC

## 2018-07-29 MED ORDER — SODIUM CHLORIDE 0.9 % IV SOLN
INTRAVENOUS | Status: DC
  Administered 2018-07-30: 08:00:00 via INTRAVENOUS

## 2018-07-29 MED ORDER — VITAMIN B-1 100 MG PO TABS: 100.0000 mg | ORAL_TABLET | Freq: Every day | ORAL | Status: DC

## 2018-07-29 MED ORDER — ADULT MULTIVITAMIN W/MINERALS CH
1.0000 | ORAL_TABLET | Freq: Every day | ORAL | Status: DC
  Administered 2018-07-29 – 2018-07-31 (×3): 1 via ORAL
  Filled 2018-07-29 (×3): qty 1

## 2018-07-29 MED ORDER — LORAZEPAM 2 MG/ML IJ SOLN
0.0000 mg | Freq: Four times a day (QID) | INTRAMUSCULAR | Status: DC
  Administered 2018-07-29: 1 mg via INTRAVENOUS
  Administered 2018-07-30 – 2018-07-31 (×2): 2 mg via INTRAVENOUS
  Filled 2018-07-29 (×4): qty 1

## 2018-07-29 MED ORDER — CLONIDINE HCL 0.1 MG PO TABS: 0.1000 mg | ORAL_TABLET | ORAL | Status: DC

## 2018-07-29 MED ORDER — CLONIDINE HCL 0.1 MG PO TABS
0.1000 mg | ORAL_TABLET | Freq: Four times a day (QID) | ORAL | Status: DC
  Administered 2018-07-29 – 2018-07-31 (×8): 0.1 mg via ORAL
  Filled 2018-07-29 (×8): qty 1

## 2018-07-29 MED ORDER — HYDROXYZINE HCL 25 MG PO TABS: 25.0000 mg | ORAL_TABLET | Freq: Four times a day (QID) | ORAL | Status: DC | PRN

## 2018-07-29 MED ORDER — LOPERAMIDE HCL 2 MG PO CAPS: 2.0000 mg | ORAL_CAPSULE | ORAL | Status: DC | PRN

## 2018-07-29 MED ORDER — LORAZEPAM 2 MG/ML IJ SOLN: 0.0000 mg | Freq: Two times a day (BID) | INTRAMUSCULAR | Status: DC

## 2018-07-29 MED ORDER — FOLIC ACID 1 MG PO TABS
1.0000 mg | ORAL_TABLET | Freq: Every day | ORAL | Status: DC
  Administered 2018-07-30 – 2018-07-31 (×2): 1 mg via ORAL
  Filled 2018-07-29 (×2): qty 1

## 2018-07-29 MED ORDER — ACETAMINOPHEN 650 MG RE SUPP: 650.0000 mg | Freq: Four times a day (QID) | RECTAL | Status: DC | PRN

## 2018-07-29 MED ORDER — NAPROXEN 250 MG PO TABS
500.0000 mg | ORAL_TABLET | Freq: Two times a day (BID) | ORAL | Status: DC | PRN
  Filled 2018-07-29: qty 2

## 2018-07-29 MED ORDER — FOLIC ACID 1 MG PO TABS: 1.0000 mg | ORAL_TABLET | Freq: Every day | ORAL | Status: DC

## 2018-07-29 MED ORDER — DICYCLOMINE HCL 20 MG PO TABS: 20.0000 mg | ORAL_TABLET | Freq: Four times a day (QID) | ORAL | Status: DC | PRN

## 2018-07-29 MED ORDER — VITAMIN B-1 100 MG PO TABS
100.0000 mg | ORAL_TABLET | Freq: Every day | ORAL | Status: DC
  Administered 2018-07-30 – 2018-07-31 (×2): 100 mg via ORAL
  Filled 2018-07-29 (×2): qty 1

## 2018-07-29 MED ORDER — LORAZEPAM 2 MG/ML IJ SOLN
1.0000 mg | Freq: Four times a day (QID) | INTRAMUSCULAR | Status: DC | PRN
  Administered 2018-07-30: 1 mg via INTRAVENOUS

## 2018-07-29 MED ORDER — DIPHENHYDRAMINE HCL 25 MG PO TABS
50.0000 mg | ORAL_TABLET | Freq: Four times a day (QID) | ORAL | Status: DC | PRN
  Filled 2018-07-29: qty 2

## 2018-07-29 MED ORDER — ALBUTEROL SULFATE (2.5 MG/3ML) 0.083% IN NEBU
2.5000 mg | INHALATION_SOLUTION | Freq: Four times a day (QID) | RESPIRATORY_TRACT | Status: DC
  Administered 2018-07-29: 2.5 mg via RESPIRATORY_TRACT
  Filled 2018-07-29: qty 3

## 2018-07-29 NOTE — ED Provider Notes (Signed)
[facility] EMERGENCY DEPARTMENT Provider Note   CSN: [id number] Arrival date & time: 07/29/18  1747     History   Chief Complaint No chief complaint on file.   HPI Joel [name] is a 27 y.o. male.  HPI Patient presents requesting help with detox.  States he drinks alcohol heavily.  About a case of beer and a pint a day.  States that he last drank a day and a half ago.  States has had nausea vomiting with some diarrhea over the last 3 days.  States he has had chills.  Over the last year the longest he has had sober is 60 days.  States he is worried he has severe alcohol withdrawal.  Presents with mobile crisis.  Also uses heroin.  States that he has had chills without frank fever.  States that his right arm has been swollen.  He checks only into his left arm.  Last injected heroin earlier today.  No chest pain.  No trouble breathing.  No abdominal pain.  No suicidal thoughts.  He has had a rash on his right forearm with some swelling over the last few days also. Past Medical History:  Diagnosis Date  . Enlarged lymph node 2014    Patient Active Problem List   Diagnosis Date Noted  . Enlarged lymph node     Past Surgical History:  Procedure Laterality Date  . TONSILLECTOMY          Home Medications    Prior to Admission medications   Medication Sig Start Date End Date Taking? Authorizing Provider  diphenhydrAMINE (BENADRYL) 25 MG tablet Take 50 mg by mouth every 6 (six) hours as needed for itching or allergies.   Yes [provider]  amantadine (SYMMETREL) 100 MG capsule Take 1 capsule (100 mg total) by mouth 2 (two) times daily. Patient not taking: Reported on 07/29/2018 11/07/17   Maple Hudson., MD  ARIPiprazole (ABILIFY) 5 MG tablet Take 1 tablet (5 mg total) by mouth at bedtime. Patient not taking: Reported on 07/29/2018 11/07/17   Maple Hudson., MD  cloNIDine (CATAPRES) 0.1 MG tablet Take 1 tablet (0.1 mg total)  by mouth 2 (two) times daily. Patient not taking: Reported on 11/07/2017 12/17/14   Jaynie Crumble, PA-C  DULoxetine (CYMBALTA) 30 MG capsule Take 1 capsule (30 mg total) by mouth every morning. Patient not taking: Reported on 07/29/2018 11/07/17   Maple Hudson., MD    Family History History reviewed. No pertinent family history.  Social History Social History   Tobacco Use  . Smoking status: Current Every Day Smoker    Packs/day: 1.00    Years: 6.00    Pack years: 6.00    Types: Cigarettes  . Smokeless tobacco: Never Used  Substance Use Topics  . Alcohol use: Yes    Comment: every 3 days   . Drug use: No    Types: Cocaine    Comment: denies any drug use.      Allergies   Poison ivy extract [poison ivy extract] and Zoloft [sertraline hcl]   Review of Systems Review of Systems  Constitutional: Positive for appetite change.  HENT: Negative for congestion.   Respiratory: Negative for shortness of breath.   Cardiovascular: Negative for chest pain.  Gastrointestinal: Positive for diarrhea, nausea and vomiting. Negative for anal bleeding.  Genitourinary: Negative for flank pain and frequency.  Skin: Positive for rash.  Neurological: Positive for headaches. Negative for weakness.  Psychiatric/Behavioral: Negative for confusion.     Physical Exam Updated Vital Signs BP 120/73   Pulse 62   Temp 97.9 F (36.6 C) (Oral)   Resp 11   Ht 5\' 8"  (1.727 m)   Wt 68 kg   SpO2 96%   BMI 22.81 kg/m   Physical Exam  Constitutional: He appears well-developed.  HENT:  Head: Atraumatic.  Eyes: Pupils are equal, round, and reactive to light.  Neck: Neck supple.  Cardiovascular:  No murmur heard. Pulmonary/Chest: He has no wheezes.  Abdominal: There is no tenderness.  Musculoskeletal: He exhibits edema.  Swelling and rash with right forearm.  There are red swollen areas from 5 mm up to around 2 mm.  Some confluent.  They are raised.  They are pruritic.     Neurological: He is alert.  Skin: Skin is warm. Capillary refill takes less than 2 seconds.  No clear petechiae.  No splinter hemorrhages.     ED Treatments / Results  Labs (all labs ordered are listed, but only abnormal results are displayed) Labs Reviewed  COMPREHENSIVE METABOLIC PANEL - Abnormal; Notable for the following components:      Result Value   Sodium 129 (*)    Chloride 88 (*)    Glucose, Bld 123 (*)    Total Bilirubin 1.9 (*)    All other components within normal limits  URINALYSIS, ROUTINE W REFLEX MICROSCOPIC - Abnormal; Notable for the following components:   APPearance HAZY (*)    Ketones, ur 5 (*)    All other components within normal limits  CULTURE, BLOOD (ROUTINE X 2)  CULTURE, BLOOD (ROUTINE X 2)  CBC WITH DIFFERENTIAL/PLATELET  ETHANOL  LIPASE, BLOOD  HIV ANTIBODY (ROUTINE TESTING W REFLEX)  RAPID URINE DRUG SCREEN, HOSP PERFORMED    EKG EKG Interpretation  Date/Time:  Saturday July 29 2018 20:03:14 EST Ventricular Rate:  62 PR Interval:    QRS Duration: 90 QT Interval:  494 QTC Calculation: 502 R Axis:   89 Text Interpretation:  Sinus rhythm Prolonged QT interval Confirmed by Benjiman Core [phone]) on 07/29/2018 8:25:31 PM   Radiology Dg Chest 2 View  Result Date: 07/29/2018 CLINICAL DATA:  Nausea, vomiting and diarrhea. EXAM: CHEST - 2 VIEW COMPARISON:  None. FINDINGS: The heart size and mediastinal contours are within normal limits. Both lungs are clear. The visualized skeletal structures are unremarkable. IMPRESSION: Normal chest x-ray. Electronically Signed   By: Rudie Meyer M.D.   On: 07/29/2018 19:24    Procedures Procedures (including critical care time)  Medications Ordered in ED Medications  ondansetron (ZOFRAN) injection 4 mg (4 mg Intravenous Given 07/29/18 1955)     Initial Impression / Assessment and Plan / ED Course  I have reviewed the triage vital signs and the nursing notes.  Pertinent labs & imaging  results that were available during my care of the patient were reviewed by me and considered in my medical decision making (see chart for details).     Patient with nausea vomiting.  Has been vomiting.  Coming off alcohol last used a day and a half ago.  No hallucinations.  Does have however a new rash on his right forearm.  With [name] drug use there is some worry of endocarditis.  Has had chills but not frank fever.  Continues to feel bad.  Will discuss with unassigned internal medicine about admission.  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

## 2018-07-29 NOTE — H&P (Signed)
History and Physical    Joel [name] FUX:[id number] DOB: 1991-02-21 DOA: 07/29/2018  Referring MD/NP/PA: Dorene Ar, MD PCP: Jerrol Banana., MD  Patient coming from: home  Chief Complaint: Detox  I have personally briefly reviewed patient's old medical records in [place]   HPI: Joel [name] is a 27 y.o. male with medical history significant of polysubstance abuse (heroin and alcohol abuse); who presents requesting detox from drugs and alcohol.  Patient reports for the last 3 days he been having nausea and vomiting unable to keep any significant amount of food or liquids down.  Associated symptoms include chills, loose stools, and new onset burning-like rash on his arms and legs.  He reports the rash came up over the last day or so, and most notable on his right forearm where there has been swelling.  Patient reports normally drinking a 12 pack and a pint of beer per day or every other day on average.  Last drink was a day ago and he last injected heroin earlier today.  Patient normally injects in his left arm.  He reports that he does not always use fresh needles when doing so.  Denies having any chest pain, shortness of breath, or suicidal thoughts.   ED Course: Upon admission into the emergency department patient was found to be afebrile, blood pressure 119/97-150 2/90, and all other vital signs maintained.  Labs revealed normal CBC, sodium 129, chloride 88, total bilirubin 1.9, and all other values appear to be within normal limits.  Urinalysis did not show any acute signs of infection.  Review of Systems  Constitutional: Positive for chills and malaise/fatigue. Negative for fever.  HENT: Negative for congestion and sinus pain.   Eyes: Negative for photophobia and redness.  Respiratory: Negative for cough and shortness of breath.   Cardiovascular: Negative for chest pain and leg swelling.  Gastrointestinal: Positive for nausea and  vomiting. Negative for abdominal pain.  Genitourinary: Negative for dysuria and frequency.  Musculoskeletal: Negative for joint pain and myalgias.  Skin: Positive for itching and rash.  Neurological: Negative for focal weakness, loss of consciousness and weakness.  Psychiatric/Behavioral: Positive for substance abuse. Negative for suicidal ideas.    Past Medical History:  Diagnosis Date  . Enlarged lymph node 2014    Past Surgical History:  Procedure Laterality Date  . TONSILLECTOMY       reports that he has been smoking cigarettes. He has a 6.00 pack-year smoking history. He has never used smokeless tobacco. He reports that he drinks alcohol. He reports that he does not use drugs.  Allergies  Allergen Reactions  . Poison Ivy Extract [Poison Ivy Extract]   . Zoloft [Sertraline Hcl]     History reviewed. No pertinent family history.  Prior to Admission medications   Medication Sig Start Date End Date Taking? Authorizing Provider  diphenhydrAMINE (BENADRYL) 25 MG tablet Take 50 mg by mouth every 6 (six) hours as needed for itching or allergies.   Yes [provider]  amantadine (SYMMETREL) 100 MG capsule Take 1 capsule (100 mg total) by mouth 2 (two) times daily. Patient not taking: Reported on 07/29/2018 11/07/17   Jerrol Banana., MD  ARIPiprazole (ABILIFY) 5 MG tablet Take 1 tablet (5 mg total) by mouth at bedtime. Patient not taking: Reported on 07/29/2018 11/07/17   Jerrol Banana., MD  cloNIDine (CATAPRES) 0.1 MG tablet Take 1 tablet (0.1 mg total) by mouth 2 (two) times daily. Patient  not taking: Reported on 11/07/2017 12/17/14   Jeannett Senior, PA-C  DULoxetine (CYMBALTA) 30 MG capsule Take 1 capsule (30 mg total) by mouth every morning. Patient not taking: Reported on 07/29/2018 11/07/17   Jerrol Banana., MD    Physical Exam:  Constitutional: Young male in NAD, and able to follow commands Vitals:   07/29/18 2015 07/29/18 2030 07/29/18  2045 07/29/18 2100  BP: 127/75 128/72 123/69 120/73  Pulse: 62 61 64 62  Resp: '15 18 11 11  ' Temp:      TempSrc:      SpO2: 96% 96% 95% 96%  Weight:      Height:       Eyes: PERRL, lids and conjunctivae normal ENMT: Mucous membranes are moist. Posterior pharynx clear of any exudate or lesions. Normal dentition.  Neck: normal, supple, no masses, no thyromegaly Respiratory: clear to auscultation bilaterally, no wheezing, no crackles. Normal respiratory effort. No accessory muscle use.  Cardiovascular: Regular rate and rhythm, no murmurs / rubs / gallops. No extremity edema. 2+ pedal pulses. No carotid bruits.  Abdomen: no tenderness, no masses palpated. No hepatosplenomegaly. Bowel sounds positive.  Musculoskeletal: no clubbing / cyanosis. No joint deformity upper and lower extremities. Good ROM, no contractures. Normal muscle tone.  Skin: Track marks present of the left antecubital fossa and forearm.  Red swollen maculopapular areas most notably on right forearm and  legs as imaged below.  No signs of splinter imagery.       . Neurologic: CN 2-12 grossly intact. Sensation intact, DTR normal. Strength 5/5 in all 4.  Psychiatric: Normal judgment and insight. Alert and oriented x 3. Normal mood.     Labs on Admission: I have personally reviewed following labs and imaging studies  CBC: Recent Labs  Lab 07/29/18 1815  WBC 7.9  NEUTROABS 5.7  HGB 14.3  HCT 41.6  MCV 86.3  PLT 419   Basic Metabolic Panel: Recent Labs  Lab 07/29/18 1815  NA 129*  K 3.7  CL 88*  CO2 27  GLUCOSE 123*  BUN 14  CREATININE 1.02  CALCIUM 8.9   GFR: Estimated Creatinine Clearance: 102.8 mL/min (by C-G formula based on SCr of 1.02 mg/dL). Liver Function Tests: Recent Labs  Lab 07/29/18 1815  AST 30  ALT 23  ALKPHOS 59  BILITOT 1.9*  PROT 6.6  ALBUMIN 4.5   Recent Labs  Lab 07/29/18 1815  LIPASE 26   No results for input(s): AMMONIA in the last 168 hours. Coagulation  Profile: No results for input(s): INR, PROTIME in the last 168 hours. Cardiac Enzymes: No results for input(s): CKTOTAL, CKMB, CKMBINDEX, TROPONINI in the last 168 hours. BNP (last 3 results) No results for input(s): PROBNP in the last 8760 hours. HbA1C: No results for input(s): HGBA1C in the last 72 hours. CBG: No results for input(s): GLUCAP in the last 168 hours. Lipid Profile: No results for input(s): CHOL, HDL, LDLCALC, TRIG, CHOLHDL, LDLDIRECT in the last 72 hours. Thyroid Function Tests: No results for input(s): TSH, T4TOTAL, FREET4, T3FREE, THYROIDAB in the last 72 hours. Anemia Panel: No results for input(s): VITAMINB12, FOLATE, FERRITIN, TIBC, IRON, RETICCTPCT in the last 72 hours. Urine analysis:    Component Value Date/Time   COLORURINE YELLOW 07/29/2018 1958   APPEARANCEUR HAZY (A) 07/29/2018 1958   APPEARANCEUR Clear 05/02/2014 0142   LABSPEC 1.017 07/29/2018 1958   LABSPEC 1.009 05/02/2014 0142   PHURINE 7.0 07/29/2018 1958   GLUCOSEU NEGATIVE 07/29/2018 1958   GLUCOSEU Negative  05/02/2014 0142   HGBUR NEGATIVE 07/29/2018 [place] NEGATIVE 07/29/2018 1958   BILIRUBINUR Negative 05/02/2014 0142   KETONESUR 5 (A) 07/29/2018 [place] NEGATIVE 07/29/2018 1958   NITRITE NEGATIVE 07/29/2018 1958   LEUKOCYTESUR NEGATIVE 07/29/2018 1958   LEUKOCYTESUR Trace 05/02/2014 0142   Sepsis Labs: No results found for this or any previous visit (from the past 240 hour(s)).   Radiological Exams on Admission: Dg Chest 2 View  Result Date: 07/29/2018 CLINICAL DATA:  Nausea, vomiting and diarrhea. EXAM: CHEST - 2 VIEW COMPARISON:  None. FINDINGS: The heart size and mediastinal contours are within normal limits. Both lungs are clear. The visualized skeletal structures are unremarkable. IMPRESSION: Normal chest x-ray. Electronically Signed   By: Marijo Sanes M.D.   On: 07/29/2018 19:24    EKG: Independently reviewed.  Sinus rhythm at 62 bpm with QT prolonged at  502  Assessment/Plan Detox, polysubstance abuse: Patient presents wanting to detox from heroin and heavy alcohol abuse.  Last used heroin earlier today.  Reports using same needles repeatedly.  Thanks noted drinking 12 pack and 1 pint of beer per day.  Complaints of nausea, vomiting, and loose stools.  Suspect symptoms could be related to withdrawal. - Admit to a telemetry bed  - CWIA and COWS protocol initiated - Banana bag x1 L - Follow-up blood culture - Follow-up urine drug screen - May warrant echocardiogram - Social work consult for substance abuse  Hyponatremia: Acute.  Initial sodium noted to be 129.  Suspect hypovolemic hyponatremia related with nausea and vomiting symptoms. - After banana bag completed start normal saline at 75 mL/h  Prolonged QT interval: Initial QT prolonged at 502 on admission. - Check electrolytes and replace as needed - Avoid QT prolonging medication - Check EKG in a.m.  Rash: Patient presents with burning rash of his upper and lower extremities, but most notable on the right forearm.  Unclear cause of symptoms at this time.  Question possibility of erythema nodosum. - Follow-up blood culture - Check ESR and CRP - Naproxen as needed  Nausea, vomiting, and diarrhea - [name] fluids as tolerated - Hold antiemetics due to prolonged QT  DVT prophylaxis: Lovenox Code Status: Full Family Communication: No family present with Disposition Plan: To be determined Consults called: None Admission status: Observation  Norval Morton MD [facility] Pager [phone]   If 7PM-7AM, please contact night-coverage [email or web address] Password [email or web address]  07/29/2018, 9:30 PM

## 2018-07-29 NOTE — ED Triage Notes (Addendum)
[facility] requesting detox for drugs and alcohol. Patient also states he has not been able to hold any food or water down for 3 days. Patient report his last drink was a day ago-tried to drink a mini bottle of vodka which he then threw up. Also reporting IV heroin use this morning.

## 2018-07-30 ENCOUNTER — Observation Stay (HOSPITAL_BASED_OUTPATIENT_CLINIC_OR_DEPARTMENT_OTHER): Payer: BLUE CROSS/BLUE SHIELD

## 2018-07-30 ENCOUNTER — Encounter (HOSPITAL_COMMUNITY): Payer: Self-pay | Admitting: Internal Medicine

## 2018-07-30 DIAGNOSIS — R9431 Abnormal electrocardiogram [ECG] [EKG]: Secondary | ICD-10-CM | POA: Diagnosis present

## 2018-07-30 DIAGNOSIS — E876 Hypokalemia: Secondary | ICD-10-CM

## 2018-07-30 DIAGNOSIS — E871 Hypo-osmolality and hyponatremia: Secondary | ICD-10-CM | POA: Diagnosis present

## 2018-07-30 DIAGNOSIS — R21 Rash and other nonspecific skin eruption: Secondary | ICD-10-CM | POA: Diagnosis not present

## 2018-07-30 DIAGNOSIS — R17 Unspecified jaundice: Secondary | ICD-10-CM | POA: Diagnosis present

## 2018-07-30 DIAGNOSIS — F10230 Alcohol dependence with withdrawal, uncomplicated: Secondary | ICD-10-CM

## 2018-07-30 DIAGNOSIS — R112 Nausea with vomiting, unspecified: Secondary | ICD-10-CM | POA: Diagnosis present

## 2018-07-30 DIAGNOSIS — R197 Diarrhea, unspecified: Secondary | ICD-10-CM

## 2018-07-30 DIAGNOSIS — F1910 Other psychoactive substance abuse, uncomplicated: Secondary | ICD-10-CM | POA: Diagnosis not present

## 2018-07-30 LAB — BASIC METABOLIC PANEL
Anion gap: 11 (ref 5–15)
BUN: 11 mg/dL (ref 6–20)
CO2: 30 mmol/L (ref 22–32)
Calcium: 8.7 mg/dL — ABNORMAL LOW (ref 8.9–10.3)
Chloride: 94 mmol/L — ABNORMAL LOW (ref 98–111)
Creatinine, Ser: 0.95 mg/dL (ref 0.61–1.24)
GFR calc Af Amer: >60 mL/min (ref >60)
Glucose, Bld: 101 mg/dL — ABNORMAL HIGH (ref 70–99)
POTASSIUM: 3.2 mmol/L — AB (ref 3.5–5.1)
Sodium: 135 mmol/L (ref 135–145)

## 2018-07-30 LAB — SEDIMENTATION RATE: Sed Rate: 2 mm/hr (ref 0–16)

## 2018-07-30 LAB — CBC
HCT: 40 % (ref 39.0–52.0)
Hemoglobin: 13.6 g/dL (ref 13.0–17.0)
MCH: 29.4 pg (ref 26.0–34.0)
MCHC: 34 g/dL (ref 30.0–36.0)
MCV: 86.6 fL (ref 80.0–100.0)
Platelets: 299 10*3/uL (ref 150–400)
RBC: 4.62 MIL/uL (ref 4.22–5.81)
RDW: 11.9 % (ref 11.5–15.5)
WBC: 7.7 10*3/uL (ref 4.0–10.5)
nRBC: 0 % (ref 0.0–0.2)

## 2018-07-30 LAB — ECHOCARDIOGRAM COMPLETE
Height: 68 in
Weight: 2433.6 oz

## 2018-07-30 LAB — C-REACTIVE PROTEIN: CRP: <0.8 mg/dL (ref <1.0)

## 2018-07-30 LAB — HIV ANTIBODY (ROUTINE TESTING W REFLEX): HIV Screen 4th Generation wRfx: NONREACTIVE

## 2018-07-30 LAB — RAPID URINE DRUG SCREEN, HOSP PERFORMED
Amphetamines: NOT DETECTED
Barbiturates: NOT DETECTED
Benzodiazepines: NOT DETECTED
Cocaine: POSITIVE — AB
Opiates: POSITIVE — AB
Tetrahydrocannabinol: POSITIVE — AB

## 2018-07-30 MED ORDER — DIPHENHYDRAMINE HCL 25 MG PO CAPS: 25.0000 mg | ORAL_CAPSULE | Freq: Three times a day (TID) | ORAL | Status: DC | PRN

## 2018-07-30 MED ORDER — POTASSIUM CHLORIDE CRYS ER 20 MEQ PO TBCR
40.0000 meq | EXTENDED_RELEASE_TABLET | Freq: Once | ORAL | Status: AC
  Administered 2018-07-30: 40 meq via ORAL
  Filled 2018-07-30: qty 2

## 2018-07-30 MED ORDER — ALBUTEROL SULFATE (2.5 MG/3ML) 0.083% IN NEBU: 2.5000 mg | INHALATION_SOLUTION | Freq: Four times a day (QID) | RESPIRATORY_TRACT | Status: DC | PRN

## 2018-07-30 MED ORDER — POTASSIUM CHLORIDE IN NACL 20-0.9 MEQ/L-% IV SOLN
INTRAVENOUS | Status: DC
  Administered 2018-07-30: 14:00:00 via INTRAVENOUS
  Filled 2018-07-30 (×2): qty 1000

## 2018-07-30 NOTE — Progress Notes (Signed)
  Echocardiogram 2D Echocardiogram has been performed.  Joel [name], Joel [name] 07/30/2018, 11:11 AM

## 2018-07-30 NOTE — Progress Notes (Addendum)
[facility] PROGRESS NOTE  Migel Hannis IV EGB:[id number] DOB: 18-Apr-1991 DOA: 07/29/2018 PCP: Jerrol Banana., MD  Assessment/Plan:   Persistent nausea and vomiting related to detox, polysubstance abuse: last drink 2 days ago, last injection of heroin yesterday. reports drinking 12 pack and 1 pint of beer per day.  continues with nausea but vomiting subsided. No loose stools yet today. CIWA score 2 and COWS score 3. UDS + for opiates, cocaine and tetrahydrocannabinol. Suspect symptoms could be related to withdrawal and anticipate this will get worse given timing of last etoh consumption and heroin injection. Currently VSS  - continue CWIA and COWS protocol initiated - Follow blood culture - obtain echocardiogram -prn anti-emetic -IV fluids - Social work consult for substance abuse  Hyponatremia: Acute.  Related to above. Resolved this am. Likley hypovolemic hyponatremia related with nausea and vomiting symptoms. - continue IV fluids   Prolonged QT interval: Initial QT prolonged at 502 on admission. Repeat EKG w/o prolongation - Monitor electrolytes and replace as needed  Rash: Patient presents with burning rash of his upper and lower extremities, but most notable on the right forearm. Concern for IV user related issues.   Unclear cause of symptoms at this time. ESR and CRP within limits of normal -obtain echo -hepatitis panel  - Follow up blood culture - Naproxen as needed  Code Status: full Family Communication: none present Disposition Plan: home when ready vs rehab-- suspect will be here for several days   Consultants:  none  Procedures:  echo   HPI/Subjective: 27 yo polysubstance abuser admitted for etoh and heroin withdrawal. CIWA and COWS protocol.   Objective: Vitals:   07/30/18 0619 07/30/18 0743  BP: 119/75 115/75  Pulse: 64 65  Resp: 16 16  Temp: 98.5 F (36.9 C) 98.9 F (37.2 C)  SpO2: 99% 97%    Intake/Output Summary  (Last 24 hours) at 07/30/2018 0942 Last data filed at 07/30/2018 3710 Gross per 24 hour  Intake 483 ml  Output 700 ml  Net -217 ml   Filed Weights   07/29/18 1802 07/30/18 0619  Weight: 68 kg 69 kg    Exam:   General:  Well nourished in no acute distress  Cardiovascular: rrr no mgr no LE edema  Respiratory: normal effort BS clear bilaterally no wheeze  Abdomen: flat soft +BS no guarding or rebounding  Musculoskeletal: joints without swelling/erythema  Skin: warm and dry, right fore arm with several dime sized swollen maculolpapular areas. No drainage. Tender to touch, lower leg with scabs and round light pink areas  Data Reviewed: Basic Metabolic Panel: Recent Labs  Lab 07/29/18 1815 07/29/18 2218 07/30/18 0451  NA 129*  --  135  K 3.7  --  3.2*  CL 88*  --  94*  CO2 27  --  30  GLUCOSE 123*  --  101*  BUN 14  --  11  CREATININE 1.02  --  0.95  CALCIUM 8.9  --  8.7*  MG  --  2.0  --   PHOS  --  3.1  --    Liver Function Tests: Recent Labs  Lab 07/29/18 1815  AST 30  ALT 23  ALKPHOS 59  BILITOT 1.9*  PROT 6.6  ALBUMIN 4.5   Recent Labs  Lab 07/29/18 1815  LIPASE 26   No results for input(s): AMMONIA in the last 168 hours. CBC: Recent Labs  Lab 07/29/18 1815 07/30/18 0451  WBC 7.9 7.7  NEUTROABS 5.7  --  HGB 14.3 13.6  HCT 41.6 40.0  MCV 86.3 86.6  PLT 322 299   Cardiac Enzymes: No results for input(s): CKTOTAL, CKMB, CKMBINDEX, TROPONINI in the last 168 hours. BNP (last 3 results) No results for input(s): BNP in the last 8760 hours.  ProBNP (last 3 results) No results for input(s): PROBNP in the last 8760 hours.  CBG: No results for input(s): GLUCAP in the last 168 hours.  No results found for this or any previous visit (from the past 240 hour(s)).   Studies: Dg Chest 2 View  Result Date: 07/29/2018 CLINICAL DATA:  Nausea, vomiting and diarrhea. EXAM: CHEST - 2 VIEW COMPARISON:  None. FINDINGS: The heart size and mediastinal  contours are within normal limits. Both lungs are clear. The visualized skeletal structures are unremarkable. IMPRESSION: Normal chest x-ray. Electronically Signed   By: Marijo Sanes M.D.   On: 07/29/2018 19:24    Scheduled Meds: . cloNIDine  0.1 mg Oral QID   Followed by  . [START ON 08/01/2018] cloNIDine  0.1 mg Oral BH-qamhs   Followed by  . [START ON 08/04/2018] cloNIDine  0.1 mg Oral QAC breakfast  . enoxaparin (LOVENOX) injection  40 mg Subcutaneous Q24H  . folic acid  1 mg Oral Daily  . LORazepam  0-4 mg Intravenous Q6H   Followed by  . [START ON 07/31/2018] LORazepam  0-4 mg Intravenous Q12H  . multivitamin with minerals  1 tablet Oral Daily  . sodium chloride flush  3 mL Intravenous Q12H  . thiamine  100 mg Oral Daily   Or  . thiamine  100 mg Intravenous Daily   Continuous Infusions: . sodium chloride 75 mL/hr at 07/30/18 0818    Principal Problem:   Polysubstance abuse (HCC) Active Problems:   Rash   Prolonged QT interval   Hyponatremia   Nausea vomiting and diarrhea    Time spent: 45 minutes    [place] NP [facility]  If 7PM-7AM, please contact night-coverage at [email or web address], password [facility] 07/30/2018, 9:42 AM  LOS: 0 days     Patient was seen, examined,treatment plan was discussed with the Advance Practice Provider.  I have personally reviewed the clinical findings, labs, EKG, imaging studies and management of this patient in detail. I have also reviewed the orders written for this patient which were under my direction. I agree with the documentation, as recorded by the Advance Practice Provider.   Romey Mathieson IV is a 27 y.o. male with long history of drug abuse.  Here for de-tox.  Has been to rehab in past.  IVF with K to replete potassium.  Ativan and clonidine.  Social work consult for rehab.  "rash" not consistent with MRSA so will d/c contact. Appears more like bite-- benadryl for itching.   Geradine Girt,  DO

## 2018-07-30 NOTE — Progress Notes (Signed)
Patient reported did well on clear liquid diet without any issues.  Per MD, ok to advance to full liquids.

## 2018-07-31 DIAGNOSIS — E871 Hypo-osmolality and hyponatremia: Secondary | ICD-10-CM | POA: Diagnosis present

## 2018-07-31 DIAGNOSIS — R17 Unspecified jaundice: Secondary | ICD-10-CM | POA: Diagnosis present

## 2018-07-31 DIAGNOSIS — Z888 Allergy status to other drugs, medicaments and biological substances status: Secondary | ICD-10-CM | POA: Diagnosis not present

## 2018-07-31 DIAGNOSIS — F1123 Opioid dependence with withdrawal: Secondary | ICD-10-CM | POA: Diagnosis present

## 2018-07-31 DIAGNOSIS — F17210 Nicotine dependence, cigarettes, uncomplicated: Secondary | ICD-10-CM | POA: Diagnosis present

## 2018-07-31 DIAGNOSIS — R21 Rash and other nonspecific skin eruption: Secondary | ICD-10-CM | POA: Diagnosis present

## 2018-07-31 DIAGNOSIS — E876 Hypokalemia: Secondary | ICD-10-CM | POA: Diagnosis present

## 2018-07-31 DIAGNOSIS — Z91048 Other nonmedicinal substance allergy status: Secondary | ICD-10-CM | POA: Diagnosis not present

## 2018-07-31 DIAGNOSIS — F1410 Cocaine abuse, uncomplicated: Secondary | ICD-10-CM | POA: Diagnosis present

## 2018-07-31 DIAGNOSIS — Z79899 Other long term (current) drug therapy: Secondary | ICD-10-CM | POA: Diagnosis not present

## 2018-07-31 DIAGNOSIS — F1910 Other psychoactive substance abuse, uncomplicated: Secondary | ICD-10-CM | POA: Diagnosis not present

## 2018-07-31 DIAGNOSIS — G4700 Insomnia, unspecified: Secondary | ICD-10-CM | POA: Diagnosis present

## 2018-07-31 DIAGNOSIS — F10230 Alcohol dependence with withdrawal, uncomplicated: Secondary | ICD-10-CM | POA: Diagnosis present

## 2018-07-31 DIAGNOSIS — F1290 Cannabis use, unspecified, uncomplicated: Secondary | ICD-10-CM | POA: Diagnosis present

## 2018-07-31 DIAGNOSIS — E861 Hypovolemia: Secondary | ICD-10-CM | POA: Diagnosis present

## 2018-07-31 LAB — HEPATITIS PANEL, ACUTE
Hep A IgM: NEGATIVE
Hep B C IgM: NEGATIVE
Hepatitis B Surface Ag: NEGATIVE

## 2018-07-31 LAB — CBC
HCT: 40.4 % (ref 39.0–52.0)
Hemoglobin: 13.7 g/dL (ref 13.0–17.0)
MCH: 29.5 pg (ref 26.0–34.0)
MCHC: 33.9 g/dL (ref 30.0–36.0)
MCV: 87.1 fL (ref 80.0–100.0)
Platelets: 290 10*3/uL (ref 150–400)
RBC: 4.64 MIL/uL (ref 4.22–5.81)
RDW: 11.9 % (ref 11.5–15.5)
WBC: 8.7 10*3/uL (ref 4.0–10.5)
nRBC: 0 % (ref 0.0–0.2)

## 2018-07-31 LAB — COMPREHENSIVE METABOLIC PANEL
ALBUMIN: 3.8 g/dL (ref 3.5–5.0)
ALT: 17 U/L (ref 0–44)
ANION GAP: 10 (ref 5–15)
AST: 17 U/L (ref 15–41)
Alkaline Phosphatase: 52 U/L (ref 38–126)
BUN: 8 mg/dL (ref 6–20)
CO2: 22 mmol/L (ref 22–32)
Calcium: 8.9 mg/dL (ref 8.9–10.3)
Chloride: 104 mmol/L (ref 98–111)
Creatinine, Ser: 0.92 mg/dL (ref 0.61–1.24)
GFR calc Af Amer: >60 mL/min (ref >60)
GFR calc non Af Amer: >60 mL/min (ref >60)
Glucose, Bld: 136 mg/dL — ABNORMAL HIGH (ref 70–99)
Potassium: 3.9 mmol/L (ref 3.5–5.1)
Sodium: 136 mmol/L (ref 135–145)
Total Bilirubin: 1.1 mg/dL (ref 0.3–1.2)
Total Protein: 6 g/dL — ABNORMAL LOW (ref 6.5–8.1)

## 2018-07-31 MED ORDER — TRAZODONE HCL 50 MG PO TABS: 50.0000 mg | ORAL_TABLET | Freq: Every day | ORAL | Status: DC

## 2018-07-31 MED ORDER — NICOTINE 14 MG/24HR TD PT24
14.0000 mg | MEDICATED_PATCH | Freq: Every day | TRANSDERMAL | Status: DC
  Administered 2018-07-31: 14 mg via TRANSDERMAL
  Filled 2018-07-31: qty 1

## 2018-07-31 NOTE — Progress Notes (Signed)
Pt came to desk to sign out AMA. Primary RN educated pt on staying and risks with leaving. CN also educated, stated risks could include death, pt verbalizes understandings and signs AMA. CN paged MD, no response. Pt ambulated off unit with all belongings.

## 2018-07-31 NOTE — Clinical Social Work Note (Signed)
CSW went to provide list of [facility]. Pt states he has spoken with [facility] [place] and they have avaliability. CSW left list at bedside. Per RN MD states tentative d/c from tomorrow.   AldenBridget Crista [place], [place] [phone]

## 2018-07-31 NOTE — Plan of Care (Signed)
Patient states that he is still having anxiety, no complaints of nausea this AM; good appetite this AM. Patient states that he is not sleeping well at night, this was discussed with MD this AM and [employer] ordered for sleep.

## 2018-07-31 NOTE — Progress Notes (Signed)
Called to patients room, patient expressed wanting to leave AMA.  Discussed thoroughly with patient the risks of leaving against medical advice and the possibility of insurance not covering the hospital stay and no medications will be given at discharge.  Patient stated he wanted to make a phone call and he would think about it and call me back.  Dr. Benjamine MolaVann paged, awaiting reply

## 2018-07-31 NOTE — Clinical Social Work Note (Signed)
Clinical Social Work Assessment  Patient Details  Name: Joel [name] MRN: [id number] Date of Birth: 20-Feb-1991  Date of referral:  07/31/18               Reason for consult:  Substance Use/ETOH Abuse                Permission sought to share information with:    Permission granted to share information::  No  Name::        Agency::     Relationship::     Contact Information:     Housing/Transportation Living arrangements for the past 2 months:  Single Family Home Source of Information:  Patient, Medical Team Patient Interpreter Needed:  None Criminal Activity/Legal Involvement Pertinent to Current Situation/Hospitalization:  No - Comment as needed Significant Relationships:  Parents Lives with:    Do you feel safe going back to the place where you live?  Yes Need for family participation in patient care:  Yes (Comment)  Care giving concerns:  Substance abuse.   Social Worker assessment / plan:  CSW met with patient. No supports at bedside. CSW introduced role and inquired about interest in substance abuse resources. Patient confirmed. He lives in [place] but wants treatment closer to [place]. CSW provided list of inpatient and outpatient options. No further concerns. CSW signing off as social work intervention is no longer needed.  Employment status:  [employer] information:  Other (Comment Required)(BCBS) PT Recommendations:  Not assessed at this time Information / Referral to community resources:  Residential Substance Abuse Treatment Options, Outpatient Substance Abuse Treatment Options  Patient/Family's Response to care:  Patient agreeable to receiving resources. Patient's parents supportive and involved in patient's care. Patient appreciated social work intervention.  Patient/Family's Understanding of and Emotional Response to Diagnosis, Current Treatment, and Prognosis:  Patient has a good understanding of the reason for admission and social  work consult. Patient appears pleased with hospital care.  Emotional Assessment Appearance:  Appears stated age Attitude/Demeanor/Rapport:  Engaged, Gracious Affect (typically observed):  Accepting, Appropriate, Calm, Pleasant Orientation:  Oriented to Self, Oriented to Place, Oriented to  Time, Oriented to Situation Alcohol / Substance use:  Illicit Drugs Psych involvement (Current and /or in the community):  No (Comment)  Discharge Needs  Concerns to be addressed:  Care Coordination Readmission within the last 30 days:  No Current discharge risk:  Substance Abuse Barriers to Discharge:  Continued Medical Work up   Candie Chroman, LCSW 07/31/2018, 12:22 PM

## 2018-07-31 NOTE — Progress Notes (Signed)
Patient is asking if his IV can come out.  [facility] and patient states that it bothers him to bend his arm.  He would like to leave IV out if possible with anticipation of d/c tomorrow on 12/10.  Dr. Benjamine MolaVann notified

## 2018-07-31 NOTE — Progress Notes (Signed)
Patient removed PIV, MD aware. Ok, to leave out

## 2018-07-31 NOTE — Progress Notes (Addendum)
[facility] PROGRESS NOTE  Shivam Mestas ZOX:[id number] DOB: Feb 17, 1991 DOA: 07/29/2018 PCP: Maple Hudson., MD   27 yo polysubstance abuser admitted for etoh and heroin withdrawal. CIWA and COWS protocol.   Assessment/Plan:   Persistent nausea and vomiting related to detox, polysubstance abuse:  -UDS + for opiates, cocaine and tetrahydrocannabinol.  -Suspect symptoms could be related to withdrawal and anticipate this will get worse given timing of last etoh consumption and heroin injection - continue CWIA (schedule and PRN) and COWS protocol initiated -blood culture NGTD -  echocardiogram wnl -prn anti-emetic - Social work consult for substance abuse/rehab  Insomnia trazodone QHS  Hyponatremia: Acute.   - Likley hypovolemic hyponatremia related with nausea and vomiting symptoms. -d/c IVF as eating now  Prolonged QT interval: -resolved -due to heroin?  Rash:  -slowly resolving -benign in appearance  Code Status: full Family Communication: none present Disposition Plan: will change to inpatient as patient is high risk for clinical deterioration related to his substance abuse-- despite schedule medications, his COWs score and CIWA score are continuing to increase- will need close monitoring    HPI/Subjective:  no tremors-- would like to detox and go to rehab  Objective: Vitals:   07/30/18 2345 07/31/18 0245  BP: 125/64 131/81  Pulse: 62 66  Resp: 20 20  Temp: 98.3 F (36.8 C) 97.8 F (36.6 C)  SpO2: 100% 98%    Intake/Output Summary (Last 24 hours) at 07/31/2018 0942 Last data filed at 07/31/2018 0934 Gross per 24 hour  Intake 1143.22 ml  Output 1200 ml  Net -56.78 ml   Filed Weights   07/29/18 1802 07/30/18 0619 07/31/18 0245  Weight: 68 kg 69 kg 69.3 kg    Exam:   General:  In bed, NAD  Cardiovascular: rrr  Respiratory: no increased work of breathing  Abdomen: +BS, soft Skin: decreasing size of right forearm red  areas, scabs w/o signs of infection on LE  Data Reviewed: Basic Metabolic Panel: Recent Labs  Lab 07/29/18 1815 07/29/18 2218 07/30/18 0451 07/31/18 0459  NA 129*  --  135 136  K 3.7  --  3.2* 3.9  CL 88*  --  94* 104  CO2 27  --  30 22  GLUCOSE 123*  --  101* 136*  BUN 14  --  11 8  CREATININE 1.02  --  0.95 0.92  CALCIUM 8.9  --  8.7* 8.9  MG  --  2.0  --   --   PHOS  --  3.1  --   --    Liver Function Tests: Recent Labs  Lab 07/29/18 1815 07/31/18 0459  AST 30 17  ALT 23 17  ALKPHOS 59 52  BILITOT 1.9* 1.1  PROT 6.6 6.0*  ALBUMIN 4.5 3.8   Recent Labs  Lab 07/29/18 1815  LIPASE 26   No results for input(s): AMMONIA in the last 168 hours. CBC: Recent Labs  Lab 07/29/18 1815 07/30/18 0451 07/31/18 0459  WBC 7.9 7.7 8.7  NEUTROABS 5.7  --   --   HGB 14.3 13.6 13.7  HCT 41.6 40.0 40.4  MCV 86.3 86.6 87.1  PLT 322 299 290   Cardiac Enzymes: No results for input(s): CKTOTAL, CKMB, CKMBINDEX, TROPONINI in the last 168 hours. BNP (last 3 results) No results for input(s): BNP in the last 8760 hours.  ProBNP (last 3 results) No results for input(s): PROBNP in the last 8760 hours.  CBG: No results for input(s): GLUCAP in  the last 168 hours.  Recent Results (from the past 240 hour(s))  Culture, blood (routine x 2)     Status: None (Preliminary result)   Collection Time: 07/29/18  6:15 PM  Result Value Ref Range Status   Specimen Description BLOOD RIGHT ANTECUBITAL  Final   Special Requests   Final    BOTTLES DRAWN AEROBIC AND ANAEROBIC Blood Culture adequate volume   Culture   Final    NO GROWTH < 24 HOURS Performed at [facility], [address] [place], [place] [address]    Report Status PENDING  Incomplete  Culture, blood (routine x 2)     Status: None (Preliminary result)   Collection Time: 07/29/18  6:15 PM  Result Value Ref Range Status   Specimen Description BLOOD LEFT ANTECUBITAL  Final   Special Requests   Final    BOTTLES DRAWN  AEROBIC AND ANAEROBIC Blood Culture adequate volume   Culture   Final    NO GROWTH < 24 HOURS Performed at [facility], [address] [place], [place] [address]    Report Status PENDING  Incomplete     Studies: Dg Chest 2 View  Result Date: 07/29/2018 CLINICAL DATA:  Nausea, vomiting and diarrhea. EXAM: CHEST - 2 VIEW COMPARISON:  None. FINDINGS: The heart size and mediastinal contours are within normal limits. Both lungs are clear. The visualized skeletal structures are unremarkable. IMPRESSION: Normal chest x-ray. Electronically Signed   By: Rudie MeyerP.  Gallerani M.D.   On: 07/29/2018 19:24    Scheduled Meds: . cloNIDine  0.1 mg Oral QID   Followed by  . [START ON 08/01/2018] cloNIDine  0.1 mg Oral BH-qamhs   Followed by  . [START ON 08/04/2018] cloNIDine  0.1 mg Oral QAC breakfast  . enoxaparin (LOVENOX) injection  40 mg Subcutaneous Q24H  . folic acid  1 mg Oral Daily  . LORazepam  0-4 mg Intravenous Q6H   Followed by  . LORazepam  0-4 mg Intravenous Q12H  . multivitamin with minerals  1 tablet Oral Daily  . sodium chloride flush  3 mL Intravenous Q12H  . thiamine  100 mg Oral Daily   Or  . thiamine  100 mg Intravenous Daily   Continuous Infusions:   Principal Problem:   Polysubstance abuse (HCC) Active Problems:   Rash   Prolonged QT interval   Hyponatremia   Nausea vomiting and diarrhea   Hypokalemia   Serum total bilirubin elevated    Time spent: 25 minutes    Joseph ArtJessica U Vann  DO [facility]  If 7PM-7AM, please contact night-coverage at [email or web address], password [email or web address] 07/31/2018, 9:42 AM  LOS: 0 days

## 2018-08-03 LAB — CULTURE, BLOOD (ROUTINE X 2)
CULTURE: NO GROWTH
Culture: NO GROWTH
Special Requests: ADEQUATE
Special Requests: ADEQUATE

## 2018-08-09 NOTE — Discharge Summary (Signed)
LEFT AMA-- please see progress note from 12/9.

## 2018-10-09 ENCOUNTER — Telehealth: Payer: Self-pay | Admitting: Family Medicine

## 2018-10-09 NOTE — Telephone Encounter (Signed)
Pt mother calling Marylu Lund. Needing Michelle Nasuti to call her back at [phone].  Wanted to ask a question.  Thanks, [employer]

## 2018-10-09 NOTE — Telephone Encounter (Signed)
Advised that Michelle Nasuti will be out of the office all week. She wanted to know if you will be willing to Rx Subutex for the patient? Please advise. Thanks!

## 2018-10-10 NOTE — Telephone Encounter (Signed)
L/M advising mother.

## 2018-10-10 NOTE — Telephone Encounter (Signed)
No.This requires special license which I do not have.

## 2018-11-03 ENCOUNTER — Other Ambulatory Visit: Payer: Self-pay

## 2018-11-03 ENCOUNTER — Ambulatory Visit: Payer: BLUE CROSS/BLUE SHIELD | Admitting: Physician Assistant

## 2018-11-03 ENCOUNTER — Encounter: Payer: Self-pay | Admitting: Physician Assistant

## 2018-11-03 VITALS — BP 123/73 | HR 64 | Temp 98.2°F | Resp 16 | Ht 67.0 in | Wt 156.0 lb

## 2018-11-03 DIAGNOSIS — L0291 Cutaneous abscess, unspecified: Secondary | ICD-10-CM | POA: Diagnosis not present

## 2018-11-03 MED ORDER — DOXYCYCLINE HYCLATE 100 MG PO TABS: 100.0000 mg | ORAL_TABLET | Freq: Two times a day (BID) | ORAL | 0 refills | Status: DC

## 2018-11-03 NOTE — Patient Instructions (Signed)

## 2018-11-03 NOTE — Progress Notes (Signed)
       Patient: Joel [name] Male    DOB: 11-25-1990   28 y.o.   MRN: [id number] Visit Date: 11/03/2018  Today's Provider: Margaretann Loveless, PA-C   Chief Complaint  Patient presents with  . Rash   Subjective:     HPI Patient here today c/o lesion on left side of jaw. Patient reports that he cut the "pimple" with a razor a few days ago. Patient reports that lesion is now having yellow discharge. Patient reports some redness and swelling. Patient denies any fever. Some tenderness with chewing. Patient does have history of multiple drug use, including [name], but has been trying to get into a long term rehab center.    Allergies  Allergen Reactions  . Poison Ivy Extract [Poison Ivy Extract]   . Zoloft [Sertraline Hcl]     No current outpatient medications on file.  Review of Systems  Constitutional: Negative.   Respiratory: Negative.   Cardiovascular: Negative.   Gastrointestinal: Negative.   Skin: Positive for rash and wound.  Neurological: Negative.     Social History   Tobacco Use  . Smoking status: Current Every Day Smoker    Packs/day: 1.00    Years: 6.00    Pack years: 6.00    Types: Cigarettes  . Smokeless tobacco: Never Used  Substance Use Topics  . Alcohol use: Yes    Comment: every 3 days       Objective:   BP 123/73 (BP Location: Left Arm, Patient Position: Sitting, Cuff Size: Normal)   Pulse 64   Temp 98.2 F (36.8 C) (Oral)   Resp 16   Ht 5\' 7"  (1.702 m)   Wt 156 lb (70.8 kg)   BMI 24.43 kg/m  Vitals:   11/03/18 1617  BP: 123/73  Pulse: 64  Resp: 16  Temp: 98.2 F (36.8 C)  TempSrc: Oral  Weight: 156 lb (70.8 kg)  Height: 5\' 7"  (1.702 m)     Physical Exam Vitals signs reviewed.  Constitutional:      General: He is not in acute distress.    Appearance: Normal appearance. He is well-developed. He is not ill-appearing.  HENT:     Head: Normocephalic and atraumatic.   Eyes:     Conjunctiva/sclera: Conjunctivae  normal.  Neck:     Musculoskeletal: Normal range of motion and neck supple.  Pulmonary:     Effort: Pulmonary effort is normal. No respiratory distress.  Neurological:     Mental Status: He is alert.  Psychiatric:        Behavior: Behavior normal.        Thought Content: Thought content normal.        Judgment: Judgment normal.         Assessment & Plan    1. Abscess Will treat with Doxycycline as below. Continue warm compresses. Tylenol for pain and/or fevers. Call if worsening.  - doxycycline (VIBRA-TABS) 100 MG tablet; Take 1 tablet (100 mg total) by mouth 2 (two) times daily.  Dispense: 20 tablet; Refill: 0     Margaretann Loveless, PA-C  [facility]

## 2019-06-23 ENCOUNTER — Encounter: Payer: Self-pay | Admitting: Emergency Medicine

## 2019-06-23 ENCOUNTER — Other Ambulatory Visit: Payer: Self-pay

## 2019-06-23 ENCOUNTER — Emergency Department: Payer: BLUE CROSS/BLUE SHIELD

## 2019-06-23 ENCOUNTER — Inpatient Hospital Stay: Payer: BLUE CROSS/BLUE SHIELD

## 2019-06-23 ENCOUNTER — Inpatient Hospital Stay
Admission: EM | Admit: 2019-06-23 | Discharge: 2019-06-25 | DRG: 603 | Disposition: A | Discharge status: Home / Self Care | Payer: BLUE CROSS/BLUE SHIELD | Attending: Internal Medicine | Admitting: Internal Medicine

## 2019-06-23 DIAGNOSIS — L0291 Cutaneous abscess, unspecified: Secondary | ICD-10-CM

## 2019-06-23 DIAGNOSIS — L03113 Cellulitis of right upper limb: Principal | ICD-10-CM

## 2019-06-23 DIAGNOSIS — L02413 Cutaneous abscess of right upper limb: Secondary | ICD-10-CM | POA: Diagnosis present

## 2019-06-23 DIAGNOSIS — Z79899 Other long term (current) drug therapy: Secondary | ICD-10-CM

## 2019-06-23 DIAGNOSIS — Z888 Allergy status to other drugs, medicaments and biological substances status: Secondary | ICD-10-CM

## 2019-06-23 DIAGNOSIS — B1920 Unspecified viral hepatitis C without hepatic coma: Secondary | ICD-10-CM | POA: Diagnosis present

## 2019-06-23 DIAGNOSIS — Z20828 Contact with and (suspected) exposure to other viral communicable diseases: Secondary | ICD-10-CM | POA: Diagnosis present

## 2019-06-23 DIAGNOSIS — M79603 Pain in arm, unspecified: Secondary | ICD-10-CM

## 2019-06-23 DIAGNOSIS — F1910 Other psychoactive substance abuse, uncomplicated: Secondary | ICD-10-CM | POA: Diagnosis present

## 2019-06-23 DIAGNOSIS — F17210 Nicotine dependence, cigarettes, uncomplicated: Secondary | ICD-10-CM | POA: Diagnosis present

## 2019-06-23 DIAGNOSIS — R609 Edema, unspecified: Secondary | ICD-10-CM

## 2019-06-23 DIAGNOSIS — F1990 Other psychoactive substance use, unspecified, uncomplicated: Secondary | ICD-10-CM

## 2019-06-23 LAB — CBC WITH DIFFERENTIAL/PLATELET
Abs Immature Granulocytes: 0.04 10*3/uL (ref 0.00–0.07)
Basophils Absolute: 0.1 10*3/uL (ref 0.0–0.1)
Basophils Relative: 1 %
Eosinophils Absolute: 0.1 10*3/uL (ref 0.0–0.5)
Eosinophils Relative: 1 %
HCT: 41 % (ref 39.0–52.0)
Hemoglobin: 14 g/dL (ref 13.0–17.0)
Immature Granulocytes: 0 %
Lymphocytes Relative: 13 %
Lymphs Abs: 1.7 10*3/uL (ref 0.7–4.0)
MCH: 29.6 pg (ref 26.0–34.0)
MCHC: 34.1 g/dL (ref 30.0–36.0)
MCV: 86.7 fL (ref 80.0–100.0)
Monocytes Absolute: 1.1 10*3/uL — ABNORMAL HIGH (ref 0.1–1.0)
Monocytes Relative: 9 %
Neutro Abs: 9.8 10*3/uL — ABNORMAL HIGH (ref 1.7–7.7)
Neutrophils Relative %: 76 %
Platelets: 285 10*3/uL (ref 150–400)
RBC: 4.73 MIL/uL (ref 4.22–5.81)
RDW: 12.4 % (ref 11.5–15.5)
WBC: 12.8 10*3/uL — ABNORMAL HIGH (ref 4.0–10.5)
nRBC: 0 % (ref 0.0–0.2)

## 2019-06-23 LAB — COMPREHENSIVE METABOLIC PANEL
ALT: 19 U/L (ref 0–44)
AST: 27 U/L (ref 15–41)
Albumin: 4.2 g/dL (ref 3.5–5.0)
Alkaline Phosphatase: 78 U/L (ref 38–126)
Anion gap: 9 (ref 5–15)
BUN: 13 mg/dL (ref 6–20)
CO2: 27 mmol/L (ref 22–32)
Calcium: 8.6 mg/dL — ABNORMAL LOW (ref 8.9–10.3)
Chloride: 100 mmol/L (ref 98–111)
Creatinine, Ser: 0.9 mg/dL (ref 0.61–1.24)
GFR calc Af Amer: >60 mL/min (ref >60)
GFR calc non Af Amer: >60 mL/min (ref >60)
Glucose, Bld: 131 mg/dL — ABNORMAL HIGH (ref 70–99)
Potassium: 4.2 mmol/L (ref 3.5–5.1)
Sodium: 136 mmol/L (ref 135–145)
Total Bilirubin: 1.6 mg/dL — ABNORMAL HIGH (ref 0.3–1.2)
Total Protein: 7.2 g/dL (ref 6.5–8.1)

## 2019-06-23 LAB — CBC
HCT: 35.9 % — ABNORMAL LOW (ref 39.0–52.0)
Hemoglobin: 11.9 g/dL — ABNORMAL LOW (ref 13.0–17.0)
MCH: 29.3 pg (ref 26.0–34.0)
MCHC: 33.1 g/dL (ref 30.0–36.0)
MCV: 88.4 fL (ref 80.0–100.0)
Platelets: 241 10*3/uL (ref 150–400)
RBC: 4.06 MIL/uL — ABNORMAL LOW (ref 4.22–5.81)
RDW: 12.5 % (ref 11.5–15.5)
WBC: 11.7 10*3/uL — ABNORMAL HIGH (ref 4.0–10.5)
nRBC: 0 % (ref 0.0–0.2)

## 2019-06-23 LAB — CREATININE, SERUM: Creatinine, Ser: <0.3 mg/dL — ABNORMAL LOW (ref 0.61–1.24)

## 2019-06-23 LAB — LACTIC ACID, PLASMA: Lactic Acid, Venous: 0.9 mmol/L (ref 0.5–1.9)

## 2019-06-23 MED ORDER — MORPHINE SULFATE (PF) 2 MG/ML IV SOLN
1.0000 mg | INTRAVENOUS | Status: DC | PRN
  Administered 2019-06-23 – 2019-06-25 (×4): 1 mg via INTRAVENOUS
  Filled 2019-06-23 (×4): qty 1

## 2019-06-23 MED ORDER — ONDANSETRON HCL 4 MG/2ML IJ SOLN: 4.0000 mg | Freq: Four times a day (QID) | INTRAMUSCULAR | Status: DC | PRN

## 2019-06-23 MED ORDER — SODIUM CHLORIDE 0.9 % IV BOLUS
1000.0000 mL | Freq: Once | INTRAVENOUS | Status: AC
  Administered 2019-06-23: 1000 mL via INTRAVENOUS

## 2019-06-23 MED ORDER — MORPHINE SULFATE (PF) 4 MG/ML IV SOLN
4.0000 mg | Freq: Once | INTRAVENOUS | Status: AC
  Administered 2019-06-23: 4 mg via INTRAVENOUS
  Filled 2019-06-23: qty 1

## 2019-06-23 MED ORDER — ACETAMINOPHEN 325 MG PO TABS: 650.0000 mg | ORAL_TABLET | Freq: Four times a day (QID) | ORAL | Status: DC | PRN

## 2019-06-23 MED ORDER — PIPERACILLIN-TAZOBACTAM 3.375 G IVPB
3.3750 g | Freq: Three times a day (TID) | INTRAVENOUS | Status: DC
  Administered 2019-06-23 – 2019-06-25 (×5): 3.375 g via INTRAVENOUS
  Filled 2019-06-23 (×10): qty 50

## 2019-06-23 MED ORDER — ACETAMINOPHEN 650 MG RE SUPP
650.0000 mg | Freq: Four times a day (QID) | RECTAL | Status: DC | PRN
  Filled 2019-06-23: qty 1

## 2019-06-23 MED ORDER — GADOBUTROL 1 MMOL/ML IV SOLN
7.0000 mL | Freq: Once | INTRAVENOUS | Status: AC | PRN
  Administered 2019-06-23: 7 mL via INTRAVENOUS
  Filled 2019-06-23: qty 7.5

## 2019-06-23 MED ORDER — ENOXAPARIN SODIUM 40 MG/0.4ML ~~LOC~~ SOLN
40.0000 mg | SUBCUTANEOUS | Status: DC
  Administered 2019-06-23 – 2019-06-24 (×2): 40 mg via SUBCUTANEOUS
  Filled 2019-06-23 (×3): qty 0.4

## 2019-06-23 MED ORDER — PIPERACILLIN-TAZOBACTAM 3.375 G IVPB 30 MIN
3.3750 g | Freq: Once | INTRAVENOUS | Status: AC
  Administered 2019-06-23: 3.375 g via INTRAVENOUS
  Filled 2019-06-23: qty 50

## 2019-06-23 MED ORDER — VANCOMYCIN HCL 10 G IV SOLR
1750.0000 mg | Freq: Once | INTRAVENOUS | Status: AC
  Administered 2019-06-23: 1750 mg via INTRAVENOUS
  Filled 2019-06-23: qty 1750

## 2019-06-23 MED ORDER — NICOTINE 21 MG/24HR TD PT24
21.0000 mg | MEDICATED_PATCH | Freq: Every day | TRANSDERMAL | Status: DC
  Administered 2019-06-23 – 2019-06-25 (×2): 21 mg via TRANSDERMAL
  Filled 2019-06-23 (×2): qty 1

## 2019-06-23 MED ORDER — KETOROLAC TROMETHAMINE 30 MG/ML IJ SOLN: 30.0000 mg | Freq: Four times a day (QID) | INTRAMUSCULAR | Status: DC | PRN

## 2019-06-23 MED ORDER — VANCOMYCIN HCL 1.25 G IV SOLR
1250.0000 mg | Freq: Two times a day (BID) | INTRAVENOUS | Status: DC
  Administered 2019-06-24 – 2019-06-25 (×3): 1250 mg via INTRAVENOUS
  Filled 2019-06-23 (×6): qty 1250

## 2019-06-23 MED ORDER — ONDANSETRON HCL 4 MG PO TABS
4.0000 mg | ORAL_TABLET | Freq: Four times a day (QID) | ORAL | Status: DC | PRN
  Filled 2019-06-23: qty 1

## 2019-06-23 NOTE — ED Notes (Signed)
Attempted IV acces x 1 unsuccessful, able to obtain blood work.  Pt placed in hospital gown due to swelling and pain to right forearm.

## 2019-06-23 NOTE — ED Provider Notes (Signed)
[facility] Emergency Department Provider Note  ____________________________________________   First MD Initiated Contact with Patient 06/23/19 1641     (approximate)  I have reviewed the triage vital signs and the nursing notes.   HISTORY  Chief Complaint Abscess    HPI Joel [name] is a 28 y.o. male presents emergency department with right arm pain.  Patient states that he was injecting heroin 3 days ago and missed.  The area has become very red and swollen.  He denies any fever or chills at this time.  States that the swelling became much worse overnight and he cannot bend his elbow.  He denies any chest pain or shortness of breath.    Past Medical History:  Diagnosis Date  . Enlarged lymph node 2014  . Polysubstance abuse (HCC)   . Total bilirubin, elevated     Patient Active Problem List   Diagnosis Date Noted  . Rash 07/30/2018  . Prolonged QT interval 07/30/2018  . Hyponatremia 07/30/2018  . Nausea vomiting and diarrhea 07/30/2018  . Hypokalemia 07/30/2018  . Serum total bilirubin elevated 07/30/2018  . Polysubstance abuse (HCC) 07/29/2018  . Enlarged lymph node     Past Surgical History:  Procedure Laterality Date  . TONSILLECTOMY      Prior to Admission medications   Medication Sig Start Date End Date Taking? Authorizing Provider  doxycycline (VIBRA-TABS) 100 MG tablet Take 1 tablet (100 mg total) by mouth 2 (two) times daily. 11/03/18   Margaretann Loveless, Joel [name]    Allergies Poison ivy extract and Sertraline hcl  History reviewed. No pertinent family history.  Social History Social History   Tobacco Use  . Smoking status: Current Every Day Smoker    Packs/day: 1.00    Years: 6.00    Pack years: 6.00    Types: Cigarettes  . Smokeless tobacco: Never Used  Substance Use Topics  . Alcohol use: Yes    Comment: every 3 days   . Drug use: No    Types: Cocaine    Comment: denies any drug use.     Review  of Systems  Constitutional: No fever/chills Eyes: No visual changes. ENT: No sore throat. Respiratory: Denies cough Genitourinary: Negative for dysuria. Musculoskeletal: Negative for back pain.  Positive for right arm pain and swelling Skin: Negative for rash.    ____________________________________________   PHYSICAL EXAM:  VITAL SIGNS: ED Triage Vitals [06/23/19 1613]  Enc Vitals Group     BP 126/67     Pulse Rate (!) 102     Resp 18     Temp 98.5 F (36.9 C)     Temp Source Oral     SpO2 98 %     Weight 160 lb (72.6 kg)     Height 5\' 9"  (1.753 m)     Head Circumference      Peak Flow      Pain Score      Pain Loc      Pain Edu?      Excl. in GC?     Constitutional: Alert and oriented. Well appearing and in no acute distress. Eyes: Conjunctivae are normal.  Head: Atraumatic. Nose: No congestion/rhinnorhea. Mouth/Throat: Mucous membranes are moist.   Neck:  supple no lymphadenopathy noted Cardiovascular: Normal rate, regular rhythm. Heart sounds are normal Respiratory: Normal respiratory effort.  No retractions, lungs c t a  GU: deferred Musculoskeletal: Decreased range of motion of the right elbow, large amount of swelling  and redness is noted, neurovascular is intact  neurologic:  Normal speech and language.  Skin:  Skin is warm, dry and intact. No rash noted. Psychiatric: Mood and affect are normal. Speech and behavior are normal.  ____________________________________________   LABS (all labs ordered are listed, but only abnormal results are displayed)  Labs Reviewed  COMPREHENSIVE METABOLIC PANEL - Abnormal; Notable for the following components:      Result Value   Glucose, Bld 131 (*)    Calcium 8.6 (*)    Total Bilirubin 1.6 (*)    All other components within normal limits  CBC WITH DIFFERENTIAL/PLATELET - Abnormal; Notable for the following components:   WBC 12.8 (*)    Neutro Abs 9.8 (*)    Monocytes Absolute 1.1 (*)    All other components  within normal limits  CULTURE, BLOOD (ROUTINE X 2)  CULTURE, BLOOD (ROUTINE X 2)  SARS CORONAVIRUS 2 (TAT 6-24 HRS)  LACTIC ACID, PLASMA  CBC WITH DIFFERENTIAL/PLATELET   ____________________________________________   ____________________________________________  RADIOLOGY  X-ray of the right elbow is negative for osteomyelitis or gas pattern Ultrasound of the right upper extremity shows a pocket of questionable abscess versus hematoma  ____________________________________________   PROCEDURES  Procedure(s) performed: Saline lock, normal saline 1 L [name], Zosyn [name], morphine 4 mg [name] no  Procedures    ____________________________________________   INITIAL IMPRESSION / ASSESSMENT AND PLAN / ED COURSE  Pertinent labs & imaging results that were available during my care of the patient were reviewed by me and considered in my medical decision making (see chart for details).   Patient is 28 year old [name] drug user who is here complaining of redness swelling and pain to the right elbow after missing while injecting heroin 3 days ago.  Physical exam shows the right elbow to be grossly red and swollen with decreased range of motion.  CBC has a elevated WBC at 12.8, glucose is elevated at 131, lactic acid is normal  DDx: Abscess, cellulitis, sepsis due to [name] drug use  X-ray of the right elbow Ultrasound right upper extremity Saline lock, normal saline 1 L [name], Zosyn [name], morphine 4 mg [name]  ----------------------------------------- 6:16 PM on 06/23/2019 -----------------------------------------  X-ray of the right elbow is negative, the ultrasound of the upper extremity does show an abscess noted near the area where he injected the heroin, unsure if it is abscess versus hematoma.  Dr. Jari Pigg in to see the patient.  She agrees patient needs to be admitted.  Paged the hospitalist.  Dr. Verdell Carmine will be admitting the patient for cellulitis.  He will consult orthopedics on the floor.   Patient was notified of the care plan and agrees to it.    Joel [name] was evaluated in Emergency Department on 06/23/2019 for the symptoms described in the history of present illness. He was evaluated in the context of the global COVID-19 pandemic, which necessitated consideration that the patient might be at risk for infection with the SARS-CoV-2 virus that causes COVID-19. Institutional protocols and algorithms that pertain to the evaluation of patients at risk for COVID-19 are in a state of rapid change based on information released by regulatory bodies including the CDC and federal and state organizations. These policies and algorithms were followed during the patient's care in the ED.   As part of my medical decision making, I reviewed the following data within the [place] notes reviewed and incorporated, Labs reviewed see above, Old chart reviewed, Radiograph reviewed  see above, Discussed with admitting physician Dr. Cherlynn KaiserSainani, Evaluated by EM attending Dr. Fuller PlanFunke, Notes from prior ED visits and [place] Controlled Substance Database  ____________________________________________   FINAL CLINICAL IMPRESSION(S) / ED DIAGNOSES  Final diagnoses:  Cellulitis of right elbow  [name] drug user      NEW MEDICATIONS STARTED DURING THIS VISIT:  New Prescriptions   No medications on file     Note:  This document was prepared using Dragon voice recognition software and may include unintentional dictation errors.    Joel [name], Joel Kuenzel [name], Joel [name] 06/23/19 1818    Concha SeFunke, Mary E, MD 06/23/19 [phone]

## 2019-06-23 NOTE — ED Triage Notes (Signed)
Pt arrived via POV with reports of abscess to right forearm/elbow area x 3 days. Pt c/o pain, swelling and redness to forearm. Swelling noted to right arm

## 2019-06-23 NOTE — H&P (Signed)
[employer] - [place] at [facility]    PATIENT NAME: Joel [name]    MR#:  [id number]  DATE OF BIRTH:  1991-02-25  DATE OF ADMISSION:  06/23/2019  PRIMARY CARE PHYSICIAN: Maple Hudson., MD   REQUESTING/REFERRING PHYSICIAN: Dr. Artis Delay  CHIEF COMPLAINT:   Chief Complaint  Patient presents with  . Abscess    HISTORY OF PRESENT ILLNESS:  Joel [name]  is a 28 y.o. male with a known history of hepatitis C, IV drug abuse who presents to the hospital due to right elbow pain swelling and redness.  Patient says he developed the symptoms overnight and have progressively gotten worse.  He does have a history of IV drug abuse and last injected in the area about 3 days ago.  He says he does not share his needles.  He does have a history of hepatitis C as mentioned.  He denies any fevers, chills, nausea, vomiting, chest pain shortness of breath or any other associated symptoms.  Given the fact that his right elbow was getting more swollen and red and he was not able to bend it he came to the ER for further evaluation.  In the emergency room patient was clinically diagnosed with right elbow/arm cellulitis and also ultrasound findings suggestive of possible abscess formation.  Hospitalist services were contacted for admission.  Patient denies any shortness of breath, cough, loss of taste, travel history or anybody in contact with Covid.  Patient's COVID-19 test is still pending  PAST MEDICAL HISTORY:   Past Medical History:  Diagnosis Date  . Enlarged lymph node 2014  . Polysubstance abuse (HCC)   . Total bilirubin, elevated     PAST SURGICAL HISTORY:   Past Surgical History:  Procedure Laterality Date  . TONSILLECTOMY      SOCIAL HISTORY:   Social History   Tobacco Use  . Smoking status: Current Every Day Smoker    Packs/day: 1.00    Years: 6.00    Pack years: 6.00    Types: Cigarettes  . Smokeless tobacco: Never Used  Substance Use Topics  .  Alcohol use: Yes    Comment: every 3 days     FAMILY HISTORY:  History reviewed. No pertinent family history.  DRUG ALLERGIES:   Allergies  Allergen Reactions  . Poison Ivy Extract Itching  . Sertraline Hcl Itching    REVIEW OF SYSTEMS:   Review of Systems  Constitutional: Negative for fever and weight loss.  HENT: Negative for congestion, nosebleeds and tinnitus.   Eyes: Negative for blurred vision, double vision and redness.  Respiratory: Negative for cough, hemoptysis and shortness of breath.   Cardiovascular: Negative for chest pain, orthopnea, leg swelling and PND.  Gastrointestinal: Negative for abdominal pain, diarrhea, melena, nausea and vomiting.  Genitourinary: Negative for dysuria, hematuria and urgency.  Musculoskeletal: Positive for joint pain (Right elbow). Negative for falls.  Neurological: Negative for dizziness, tingling, sensory change, focal weakness, seizures, weakness and headaches.  Endo/Heme/Allergies: Negative for polydipsia. Does not bruise/bleed easily.  Psychiatric/Behavioral: Negative for depression and memory loss. The patient is not nervous/anxious.     MEDICATIONS AT HOME:   Prior to Admission medications   Medication Sig Start Date End Date Taking? Authorizing Provider  doxycycline (VIBRA-TABS) 100 MG tablet Take 1 tablet (100 mg total) by mouth 2 (two) times daily. 11/03/18   Margaretann Loveless, PA-C      VITAL SIGNS:  Blood pressure 126/67, pulse (!) 102, temperature 98.5 F (36.9 C),  temperature source Oral, resp. rate 18, height 5\' 9"  (1.753 m), weight 72.6 kg, SpO2 98 %.  PHYSICAL EXAMINATION:  Physical Exam  GENERAL:  28 y.o.-year-old patient lying in the bed with no acute distress.  EYES: Pupils equal, round, reactive to light and accommodation. No scleral icterus. Extraocular muscles intact.  HEENT: Head atraumatic, normocephalic. Oropharynx and nasopharynx clear. No oropharyngeal erythema, moist oral mucosa  NECK:  Supple, no  jugular venous distention. No thyroid enlargement, no tenderness.  LUNGS: Normal breath sounds bilaterally, no wheezing, rales, rhonchi. No use of accessory muscles of respiration.  CARDIOVASCULAR: S1, S2 RRR. No murmurs, rubs, gallops, clicks.  ABDOMEN: Soft, nontender, nondistended. Bowel sounds present. No organomegaly or mass.  EXTREMITIES: No pedal edema, cyanosis, or clubbing. + 2 pedal & radial pulses b/l.  Right elbow redness swelling and pain. NEUROLOGIC: Cranial nerves II through XII are intact. No focal Motor or sensory deficits appreciated b/l PSYCHIATRIC: The patient is alert and oriented x 3.  SKIN: No obvious rash, lesion, or ulcer.   LABORATORY PANEL:   CBC Recent Labs  Lab 06/23/19 1659  WBC 12.8*  HGB 14.0  HCT 41.0  PLT 285   ------------------------------------------------------------------------------------------------------------------  Chemistries  Recent Labs  Lab 06/23/19 1623  NA 136  K 4.2  CL 100  CO2 27  GLUCOSE 131*  BUN 13  CREATININE 0.90  CALCIUM 8.6*  AST 27  ALT 19  ALKPHOS 78  BILITOT 1.6*   ------------------------------------------------------------------------------------------------------------------  Cardiac Enzymes No results for input(s): TROPONINI in the last 168 hours. ------------------------------------------------------------------------------------------------------------------  RADIOLOGY:  Dg Elbow Complete Right  Result Date: 06/23/2019 CLINICAL DATA:  Abscess in right forearm and elbow region. EXAM: RIGHT ELBOW - COMPLETE 3+ VIEW COMPARISON:  None. FINDINGS: No fracture or dislocation. No joint effusion. No foreign bodies are seen in the soft tissues. No soft tissue gas identified. IMPRESSION: Negative. Electronically Signed   By: Gerome Samavid  Williams III M.D   On: 06/23/2019 17:50   [place] Venous Img Upper Uni Right  Result Date: 06/23/2019 CLINICAL DATA:  Pain, swelling, infection, IV drug use EXAM: RIGHT UPPER  EXTREMITY VENOUS DOPPLER ULTRASOUND TECHNIQUE: Gray-scale sonography with graded compression, as well as color Doppler and duplex ultrasound were performed to evaluate the upper extremity deep venous system from the level of the subclavian vein and including the jugular, axillary, basilic, radial, ulnar and upper cephalic vein. Spectral Doppler was utilized to evaluate flow at rest and with distal augmentation maneuvers. COMPARISON:  None FINDINGS: Contralateral Subclavian Vein: Respiratory phasicity is normal and symmetric with the symptomatic side. No evidence of thrombus. Normal compressibility. Internal Jugular Vein: No evidence of thrombus. Normal compressibility, respiratory phasicity and response to augmentation. Subclavian Vein: No evidence of thrombus. Normal compressibility, respiratory phasicity and response to augmentation. Axillary Vein: No evidence of thrombus. Normal compressibility, respiratory phasicity and response to augmentation. Cephalic Vein: No evidence of thrombus. Normal compressibility, respiratory phasicity and response to augmentation. Basilic Vein: No evidence of thrombus. Normal compressibility, respiratory phasicity and response to augmentation. Brachial Veins: No evidence of thrombus. Normal compressibility, respiratory phasicity and response to augmentation. Radial Veins: No evidence of thrombus. Normal compressibility, respiratory phasicity and response to augmentation. Ulnar Veins: No evidence of thrombus. Normal compressibility, respiratory phasicity and response to augmentation. Venous Reflux:  None visualized. Other Findings: Complicated hypoechoic collection identified at site of pain at RIGHT antecubital fossa, 3.6 x 2.3 x 32.8 cm in size. Collection contains heterogeneous predominately hypoechoic material with scattered isoechoic foci. This could either represent an  abscess or a hematoma. No surrounding hypervascularity on color Doppler imaging. IMPRESSION: No evidence of  deep venous thrombosis in the RIGHT upper extremity. Complicated hypoechoic collection at RIGHT antecubital fossa 3.6 x 2.3 x 2.8 cm question abscess versus hematoma. Electronically Signed   By: Lavonia Dana M.D.   On: 06/23/2019 17:53     IMPRESSION AND PLAN:   28 year old male with past medical history of IV drug abuse, hepatitis C who presents to the hospital due to right elbow redness swelling and pain.  1.  Right elbow redness/swelling-clinically suspected to be underlying cellulitis with abscess formation. -We will treat the patient with IV vancomycin, Zosyn. -Continue supportive care with pain control.  2.  Right elbow abscess-patient on ultrasound was noted to have a possible small fluid collection in the right antecubital fossa.  I suspect this is likely abscess and unlikely hematoma given the patient's clinical symptoms and presentation. -We will get MRI of the right elbow with and without contrast.  We will get orthopedics to see the patient as patient may likely need some form of surgical drainage. -Continue pain control and IV antibiotics as mentioned above.  3.  Leukocytosis-secondary to cellulitis/abscess formation.  Follow with IV antibiotic therapy.  4.  Tobacco abuse-we will place the patient on nicotine patch.    All the records are reviewed and case discussed with ED provider. Management plans discussed with the patient, family and they are in agreement.  CODE STATUS: Full code  TOTAL TIME TAKING CARE OF THIS PATIENT: 40 minutes.    Henreitta Leber M.D on 06/23/2019 at 6:37 PM  Between 7am to 6pm - Pager - [phone]  After 6pm go to [email or web address] - password [email or web address] [facility]  [facility]  Office  [phone]  CC: Primary care physician; Jerrol Banana., MD

## 2019-06-23 NOTE — Consult Note (Signed)
Pharmacy Antibiotic Note  Joel [name] IV is a 28 y.o. male admitted on 06/23/2019 with sepsis.  Pharmacy has been consulted for Vancomycin and Zosyn dosing.  Plan: 1) Vancomycin 1250 mg IV Q 12 hrs. Goal AUC 400-550. Expected AUC: 459.8 Expected Css: 11.3 SCr used: 0.9  2) Zosyn 3.375g Q8 hours   Height: 5\' 9"  (175.3 cm) Weight: 160 lb (72.6 kg) IBW/kg (Calculated) : 70.7  Temp (24hrs), Avg:98.5 F (36.9 C), Min:98.5 F (36.9 C), Max:98.5 F (36.9 C)  Recent Labs  Lab 06/23/19 1623 06/23/19 1659  WBC  --  12.8*  CREATININE 0.90  --   LATICACIDVEN 0.9  --     Estimated Creatinine Clearance: 120 mL/min (by C-G formula based on SCr of 0.9 mg/dL).    Allergies  Allergen Reactions  . Poison Ivy Extract Itching  . Sertraline Hcl Itching    Antimicrobials this admission: Vancomycin 10/31 >>  Zosyn 10/31 >>    Microbiology results: 10/31 BCx: pending   Thank you for allowing pharmacy to be a part of this patient's care.  Zacchaeus Halm A Farran Amsden 06/23/2019 9:11 PM

## 2019-06-23 NOTE — Consult Note (Signed)
ORTHOPAEDIC CONSULTATION  REQUESTING PHYSICIAN: Henreitta Leber, MD  Chief Complaint: right arm swelling  HPI: Joel [name] is a 28 y.o. male who complains of right arm swelling and redness after injecting "meth" into his antecubital fossa 2-3 days ago.  Patient has experienced increasing swelling and decreased ROM of right elbow over the past couple days.  Patient has a history of IVDU and Hep C.  Denies fevers, chills or other systemic signs of injection/sepsis.  Ultrasound demonstrates a fluid collection in the medial aspect of the antecubital fossa.  Asked by Dr. Verdell Carmine to see the patient.    Past Medical History:  Diagnosis Date  . Enlarged lymph node 2014  . Polysubstance abuse ([place])   . Total bilirubin, elevated    Past Surgical History:  Procedure Laterality Date  . TONSILLECTOMY     Social History   Socioeconomic History  . Marital status: Single    Spouse name: Not on file  . Number of children: Not on file  . Years of education: Not on file  . Highest education level: Not on file  Occupational History  . Not on file  Social Needs  . Financial resource strain: Not on file  . Food insecurity    Worry: Not on file    Inability: Not on file  . Transportation needs    Medical: Not on file    Non-medical: Not on file  Tobacco Use  . Smoking status: Current Every Day Smoker    Packs/day: 1.00    Years: 6.00    Pack years: 6.00    Types: Cigarettes  . Smokeless tobacco: Never Used  Substance and Sexual Activity  . Alcohol use: Yes    Comment: every 3 days   . Drug use: No    Types: Cocaine    Comment: denies any drug use.   Marland Kitchen Sexual activity: Not on file  Lifestyle  . Physical activity    Days per week: Not on file    Minutes per session: Not on file  . Stress: Not on file  Relationships  . Social Herbalist on phone: Not on file    Gets together: Not on file    Attends religious service: Not on file    Active member of club  or organization: Not on file    Attends meetings of clubs or organizations: Not on file    Relationship status: Not on file  Other Topics Concern  . Not on file  Social History Narrative  . Not on file   History reviewed. No pertinent family history. Allergies  Allergen Reactions  . Poison Ivy Extract Itching  . Sertraline Hcl Itching   Prior to Admission medications   Medication Sig Start Date End Date Taking? Authorizing Provider  doxycycline (VIBRA-TABS) 100 MG tablet Take 1 tablet (100 mg total) by mouth 2 (two) times daily. 11/03/18   Mar Daring, PA-C   Dg Elbow Complete Right  Result Date: 06/23/2019 CLINICAL DATA:  Abscess in right forearm and elbow region. EXAM: RIGHT ELBOW - COMPLETE 3+ VIEW COMPARISON:  None. FINDINGS: No fracture or dislocation. No joint effusion. No foreign bodies are seen in the soft tissues. No soft tissue gas identified. IMPRESSION: Negative. Electronically Signed   By: Dorise Bullion III M.D   On: 06/23/2019 17:50   [place] Venous Img Upper Uni Right  Result Date: 06/23/2019 CLINICAL DATA:  Pain, swelling, infection, [name] drug use EXAM: RIGHT UPPER EXTREMITY VENOUS  DOPPLER ULTRASOUND TECHNIQUE: Gray-scale sonography with graded compression, as well as color Doppler and duplex ultrasound were performed to evaluate the upper extremity deep venous system from the level of the subclavian vein and including the jugular, axillary, basilic, radial, ulnar and upper cephalic vein. Spectral Doppler was utilized to evaluate flow at rest and with distal augmentation maneuvers. COMPARISON:  None FINDINGS: Contralateral Subclavian Vein: Respiratory phasicity is normal and symmetric with the symptomatic side. No evidence of thrombus. Normal compressibility. Internal Jugular Vein: No evidence of thrombus. Normal compressibility, respiratory phasicity and response to augmentation. Subclavian Vein: No evidence of thrombus. Normal compressibility, respiratory phasicity and  response to augmentation. Axillary Vein: No evidence of thrombus. Normal compressibility, respiratory phasicity and response to augmentation. Cephalic Vein: No evidence of thrombus. Normal compressibility, respiratory phasicity and response to augmentation. Basilic Vein: No evidence of thrombus. Normal compressibility, respiratory phasicity and response to augmentation. Brachial Veins: No evidence of thrombus. Normal compressibility, respiratory phasicity and response to augmentation. Radial Veins: No evidence of thrombus. Normal compressibility, respiratory phasicity and response to augmentation. Ulnar Veins: No evidence of thrombus. Normal compressibility, respiratory phasicity and response to augmentation. Venous Reflux:  None visualized. Other Findings: Complicated hypoechoic collection identified at site of pain at RIGHT antecubital fossa, 3.6 x 2.3 x 32.8 cm in size. Collection contains heterogeneous predominately hypoechoic material with scattered isoechoic foci. This could either represent an abscess or a hematoma. No surrounding hypervascularity on color Doppler imaging. IMPRESSION: No evidence of deep venous thrombosis in the RIGHT upper extremity. Complicated hypoechoic collection at RIGHT antecubital fossa 3.6 x 2.3 x 2.8 cm question abscess versus hematoma. Electronically Signed   By: Ulyses Southward M.D.   On: 06/23/2019 17:53    Positive ROS: All other systems have been reviewed and were otherwise negative with the exception of those mentioned in the HPI and as above.  Physical Exam: General: Alert, no acute distress, afebrile  MUSCULOSKELETAL: Right elbow:  Patient has intact skin aside from track mark over antecubital fossa.  There is generalized edema over the right elbow which extends into the distal 1/3 of the arm and proximal 1/3 of the forearm.   Compartments are soft.  He has ROM from -30 extension to flexion of 100 degrees with pain at the extremes of motion.     Assessment: Right  elbow cellulitis vs. abscess  Plan: I have discussed this case with hospitalist Dr. Cherlynn Kaiser.    I recommend MRI evaluation with and without contrast to determine the extent and location of fluid collection seen by ultrasound.    Patient ordered for vancomycin and zosyn [name].    Will keep NPO after midnight in case surgical debridement is warranted based on MRI.  Will reassess right elbow tomorrow after [name] antibiotics overnight.      Juanell Fairly, MD    06/23/2019 6:51 PM

## 2019-06-23 NOTE — ED Notes (Signed)
Patient transported to MRI 

## 2019-06-24 ENCOUNTER — Encounter: Payer: Self-pay | Admitting: Anesthesiology

## 2019-06-24 ENCOUNTER — Encounter
Admission: EM | Disposition: A | Discharge status: Home / Self Care | Payer: Self-pay | Source: Home / Self Care | Attending: Internal Medicine

## 2019-06-24 DIAGNOSIS — L03113 Cellulitis of right upper limb: Principal | ICD-10-CM

## 2019-06-24 DIAGNOSIS — F1990 Other psychoactive substance use, unspecified, uncomplicated: Secondary | ICD-10-CM

## 2019-06-24 DIAGNOSIS — L02413 Cutaneous abscess of right upper limb: Secondary | ICD-10-CM

## 2019-06-24 LAB — URINE DRUG SCREEN, QUALITATIVE (ARMC ONLY)
Amphetamines, Ur Screen: POSITIVE — AB
Barbiturates, Ur Screen: NOT DETECTED
Benzodiazepine, Ur Scrn: NOT DETECTED
Cannabinoid 50 Ng, Ur ~~LOC~~: NOT DETECTED
Cocaine Metabolite,Ur ~~LOC~~: NOT DETECTED
MDMA (Ecstasy)Ur Screen: NOT DETECTED
Methadone Scn, Ur: NOT DETECTED
Opiate, Ur Screen: POSITIVE — AB
Phencyclidine (PCP) Ur S: NOT DETECTED
Tricyclic, Ur Screen: NOT DETECTED

## 2019-06-24 LAB — SARS CORONAVIRUS 2 (TAT 6-24 HRS): SARS Coronavirus 2: NEGATIVE

## 2019-06-24 LAB — BASIC METABOLIC PANEL
Anion gap: 8 (ref 5–15)
BUN: 10 mg/dL (ref 6–20)
CO2: 27 mmol/L (ref 22–32)
Calcium: 8.3 mg/dL — ABNORMAL LOW (ref 8.9–10.3)
Chloride: 101 mmol/L (ref 98–111)
Creatinine, Ser: 0.8 mg/dL (ref 0.61–1.24)
GFR calc Af Amer: >60 mL/min (ref >60)
GFR calc non Af Amer: >60 mL/min (ref >60)
Glucose, Bld: 102 mg/dL — ABNORMAL HIGH (ref 70–99)
Potassium: 3.9 mmol/L (ref 3.5–5.1)
Sodium: 136 mmol/L (ref 135–145)

## 2019-06-24 LAB — CBC
HCT: 37.4 % — ABNORMAL LOW (ref 39.0–52.0)
Hemoglobin: 12.6 g/dL — ABNORMAL LOW (ref 13.0–17.0)
MCH: 29.4 pg (ref 26.0–34.0)
MCHC: 33.7 g/dL (ref 30.0–36.0)
MCV: 87.2 fL (ref 80.0–100.0)
Platelets: 248 10*3/uL (ref 150–400)
RBC: 4.29 MIL/uL (ref 4.22–5.81)
RDW: 12.5 % (ref 11.5–15.5)
WBC: 7.2 10*3/uL (ref 4.0–10.5)
nRBC: 0 % (ref 0.0–0.2)

## 2019-06-24 SURGERY — INCISION AND DRAINAGE
Anesthesia: Choice | Laterality: Right

## 2019-06-24 MED ORDER — NEOMYCIN-POLYMYXIN B GU 40-200000 IR SOLN
Status: AC
  Filled 2019-06-24: qty 4

## 2019-06-24 SURGICAL SUPPLY — 39 items
BNDG COHESIVE 6X5 TAN STRL LF (GAUZE/BANDAGES/DRESSINGS) ×2 IMPLANT
CANISTER SUCT 1200ML W/VALVE (MISCELLANEOUS) ×2 IMPLANT
COVER WAND RF STERILE (DRAPES) ×2 IMPLANT
CUFF TOURN SGL QUICK 18X4 (TOURNIQUET CUFF) IMPLANT
CUFF TOURN SGL QUICK 24 (TOURNIQUET CUFF)
CUFF TRNQT CYL 24X4X16.5-23 (TOURNIQUET CUFF) IMPLANT
DRAPE 3/4 80X56 (DRAPES) ×2 IMPLANT
DRAPE INCISE IOBAN 66X60 STRL (DRAPES) ×2 IMPLANT
DRAPE SURG 17X11 SM STRL (DRAPES) ×4 IMPLANT
DRAPE U-SHAPE 47X51 STRL (DRAPES) ×2 IMPLANT
DURAPREP 26ML APPLICATOR (WOUND CARE) ×6 IMPLANT
ELECT CAUTERY BLADE 6.4 (BLADE) ×2 IMPLANT
ELECT REM PT RETURN 9FT ADLT (ELECTROSURGICAL) ×2
ELECTRODE REM PT RTRN 9FT ADLT (ELECTROSURGICAL) ×1 IMPLANT
GAUZE SPONGE 4X4 12PLY STRL (GAUZE/BANDAGES/DRESSINGS) ×2 IMPLANT
GAUZE XEROFORM 1X8 LF (GAUZE/BANDAGES/DRESSINGS) ×2 IMPLANT
GLOVE BIOGEL PI IND STRL 9 (GLOVE) ×1 IMPLANT
GLOVE BIOGEL PI INDICATOR 9 (GLOVE) ×1
GLOVE SURG 9.0 ORTHO LTXF (GLOVE) ×6 IMPLANT
GOWN STRL REUS TWL 2XL XL LVL4 (GOWN DISPOSABLE) ×2 IMPLANT
GOWN STRL REUS W/ TWL LRG LVL3 (GOWN DISPOSABLE) ×1 IMPLANT
GOWN STRL REUS W/TWL LRG LVL3 (GOWN DISPOSABLE) ×2
HEMOVAC 400ML (MISCELLANEOUS) ×2
KIT DRAIN HEMOVAC JP 7FR 400ML (MISCELLANEOUS) ×1 IMPLANT
KIT TURNOVER KIT A (KITS) ×2 IMPLANT
NDL SAFETY ECLIPSE 18X1.5 (NEEDLE) ×1 IMPLANT
NEEDLE FILTER BLUNT 18X 1/2SAF (NEEDLE) ×1
NEEDLE FILTER BLUNT 18X1 1/2 (NEEDLE) ×1 IMPLANT
NEEDLE HYPO 18GX1.5 SHARP (NEEDLE) ×1
NS IRRIG 500ML POUR BTL (IV SOLUTION) ×2 IMPLANT
PACK EXTREMITY ARMC (MISCELLANEOUS) ×2 IMPLANT
STAPLER SKIN PROX 35W (STAPLE) ×2 IMPLANT
SUT ETHIBOND #5 BRAIDED 30INL (SUTURE) ×2 IMPLANT
SUT TICRON 2-0 30IN 311381 (SUTURE) ×2 IMPLANT
SUT VIC AB 0 CT1 27 (SUTURE) ×2
SUT VIC AB 0 CT1 27XCR 8 STRN (SUTURE) ×1 IMPLANT
SUT VIC AB 1 CTX 27 (SUTURE) ×2 IMPLANT
SYR 10ML LL (SYRINGE) ×2 IMPLANT
TAPE MICROFOAM 4IN (TAPE) ×2 IMPLANT

## 2019-06-24 NOTE — Consult Note (Signed)
Subjective:  Patient has focal abscess in antecubital area by MRI.  Patient denies any fever or chills.  Pain is controlled currently.  WBC is down to 7.2 today from 12.8 on initial presentation.   Blood cultures negative so far.  Tox screen + for Amphetamines.  Patient Covid -.  Objective:   VITALS:   Vitals:   06/23/19 1613 06/24/19 0640 06/24/19 0813  BP: 126/67 120/72 124/71  Pulse: (!) 102 90 84  Resp: 18 16 16   Temp: 98.5 F (36.9 C) 98.1 F (36.7 C) 98.6 F (37 C)  TempSrc: Oral Oral Oral  SpO2: 98% 98% 100%  Weight: 72.6 kg    Height: 5\' 9"  (1.753 m)      PHYSICAL EXAM: Right upper extremity: Swelling and erythema have not progressed.   Patient's right elbow ROM is slightly improved to -20 to 110 degrees.  There is a more organized area of fluctuance today that seems to be coming to a head under the needle tract.  Forearm and arm compartments remain soft and compressible.  Erythema extends from needle tract medially.  Distally he is neurovascularly intact and can flex and extend all fingers.     LABS  Results for orders placed or performed during the hospital encounter of 06/23/19 (from the past 24 hour(s))  Lactic acid, plasma     Status: None   Collection Time: 06/23/19  4:23 PM  Result Value Ref Range   Lactic Acid, Venous 0.9 0.5 - 1.9 mmol/L  Comprehensive metabolic panel     Status: Abnormal   Collection Time: 06/23/19  4:23 PM  Result Value Ref Range   Sodium 136 135 - 145 mmol/L   Potassium 4.2 3.5 - 5.1 mmol/L   Chloride 100 98 - 111 mmol/L   CO2 27 22 - 32 mmol/L   Glucose, Bld 131 (H) 70 - 99 mg/dL   BUN 13 6 - 20 mg/dL   Creatinine, Ser 06/25/19 0.61 - 1.24 mg/dL   Calcium 8.6 (L) 8.9 - 10.3 mg/dL   Total Protein 7.2 6.5 - 8.1 g/dL   Albumin 4.2 3.5 - 5.0 g/dL   AST 27 15 - 41 U/L   ALT 19 0 - 44 U/L   Alkaline Phosphatase 78 38 - 126 U/L   Total Bilirubin 1.6 (H) 0.3 - 1.2 mg/dL   GFR calc non Af Amer >60 >60 mL/min   GFR calc Af Amer >60 >60  mL/min   Anion gap 9 5 - 15  CBC with Differential/Platelet     Status: Abnormal   Collection Time: 06/23/19  4:59 PM  Result Value Ref Range   WBC 12.8 (H) 4.0 - 10.5 K/uL   RBC 4.73 4.22 - 5.81 MIL/uL   Hemoglobin 14.0 13.0 - 17.0 g/dL   HCT 6.75 06/25/19 - 91.6 %   MCV 86.7 80.0 - 100.0 fL   MCH 29.6 26.0 - 34.0 pg   MCHC 34.1 30.0 - 36.0 g/dL   RDW 38.4 66.5 - 99.3 %   Platelets 285 150 - 400 K/uL   nRBC 0.0 0.0 - 0.2 %   Neutrophils Relative % 76 %   Neutro Abs 9.8 (H) 1.7 - 7.7 K/uL   Lymphocytes Relative 13 %   Lymphs Abs 1.7 0.7 - 4.0 K/uL   Monocytes Relative 9 %   Monocytes Absolute 1.1 (H) 0.1 - 1.0 K/uL   Eosinophils Relative 1 %   Eosinophils Absolute 0.1 0.0 - 0.5 K/uL   Basophils Relative 1 %  Basophils Absolute 0.1 0.0 - 0.1 K/uL   Immature Granulocytes 0 %   Abs Immature Granulocytes 0.04 0.00 - 0.07 K/uL  Blood culture (routine x 2)     Status: None (Preliminary result)   Collection Time: 06/23/19  6:52 PM   Specimen: BLOOD  Result Value Ref Range   Specimen Description BLOOD L FA    Special Requests      BOTTLES DRAWN AEROBIC AND ANAEROBIC Blood Culture adequate volume   Culture      NO GROWTH < 24 HOURS Performed at [facility], [address] [place], [place] [address]    Report Status PENDING   Blood culture (routine x 2)     Status: None (Preliminary result)   Collection Time: 06/23/19  6:53 PM   Specimen: BLOOD  Result Value Ref Range   Specimen Description BLOOD L HAND    Special Requests      BOTTLES DRAWN AEROBIC AND ANAEROBIC Blood Culture results may not be optimal due to an inadequate volume of blood received in culture bottles   Culture      NO GROWTH < 24 HOURS Performed at [facility], [address] [place], [place] [address]    Report Status PENDING   SARS CORONAVIRUS 2 (TAT 6-24 HRS) Nasopharyngeal Nasopharyngeal Swab     Status: None   Collection Time: 06/23/19  6:53 PM   Specimen: Nasopharyngeal Swab   Result Value Ref Range   SARS Coronavirus 2 NEGATIVE NEGATIVE  Creatinine, serum     Status: Abnormal   Collection Time: 06/23/19 10:06 PM  Result Value Ref Range   Creatinine, Ser <0.30 (L) 0.61 - 1.24 mg/dL   GFR calc non Af Amer NOT CALCULATED >60 mL/min   GFR calc Af Amer NOT CALCULATED >60 mL/min  CBC     Status: Abnormal   Collection Time: 06/23/19 11:06 PM  Result Value Ref Range   WBC 11.7 (H) 4.0 - 10.5 K/uL   RBC 4.06 (L) 4.22 - 5.81 MIL/uL   Hemoglobin 11.9 (L) 13.0 - 17.0 g/dL   HCT 65.7 (L) 84.6 - 96.2 %   MCV 88.4 80.0 - 100.0 fL   MCH 29.3 26.0 - 34.0 pg   MCHC 33.1 30.0 - 36.0 g/dL   RDW 95.2 84.1 - 32.4 %   Platelets 241 150 - 400 K/uL   nRBC 0.0 0.0 - 0.2 %  Basic metabolic panel     Status: Abnormal   Collection Time: 06/24/19  6:37 AM  Result Value Ref Range   Sodium 136 135 - 145 mmol/L   Potassium 3.9 3.5 - 5.1 mmol/L   Chloride 101 98 - 111 mmol/L   CO2 27 22 - 32 mmol/L   Glucose, Bld 102 (H) 70 - 99 mg/dL   BUN 10 6 - 20 mg/dL   Creatinine, Ser 4.01 0.61 - 1.24 mg/dL   Calcium 8.3 (L) 8.9 - 10.3 mg/dL   GFR calc non Af Amer >60 >60 mL/min   GFR calc Af Amer >60 >60 mL/min   Anion gap 8 5 - 15  CBC     Status: Abnormal   Collection Time: 06/24/19  6:37 AM  Result Value Ref Range   WBC 7.2 4.0 - 10.5 K/uL   RBC 4.29 4.22 - 5.81 MIL/uL   Hemoglobin 12.6 (L) 13.0 - 17.0 g/dL   HCT 02.7 (L) 25.3 - 66.4 %   MCV 87.2 80.0 - 100.0 fL   MCH 29.4 26.0 -  34.0 pg   MCHC 33.7 30.0 - 36.0 g/dL   RDW 40.912.5 81.111.5 - 91.415.5 %   Platelets 248 150 - 400 K/uL   nRBC 0.0 0.0 - 0.2 %  Urine Drug Screen, Qualitative ([facility] only)     Status: Abnormal   Collection Time: 06/24/19  8:28 AM  Result Value Ref Range   Tricyclic, Ur Screen NONE DETECTED NONE DETECTED   Amphetamines, Ur Screen POSITIVE (A) NONE DETECTED   MDMA (Ecstasy)Ur Screen NONE DETECTED NONE DETECTED   Cocaine Metabolite,Ur [place] NONE DETECTED NONE DETECTED   Opiate, Ur Screen POSITIVE (A) NONE DETECTED    Phencyclidine (PCP) Ur S NONE DETECTED NONE DETECTED   Cannabinoid 50 Ng, Ur [place] NONE DETECTED NONE DETECTED   Barbiturates, Ur Screen NONE DETECTED NONE DETECTED   Benzodiazepine, Ur Scrn NONE DETECTED NONE DETECTED   Methadone Scn, Ur NONE DETECTED NONE DETECTED    Dg Elbow Complete Right  Result Date: 06/23/2019 CLINICAL DATA:  Abscess in right forearm and elbow region. EXAM: RIGHT ELBOW - COMPLETE 3+ VIEW COMPARISON:  None. FINDINGS: No fracture or dislocation. No joint effusion. No foreign bodies are seen in the soft tissues. No soft tissue gas identified. IMPRESSION: Negative. Electronically Signed   By: Gerome Samavid  Williams III M.D   On: 06/23/2019 17:50   Mr Elbow Right W Wo Contrast  Result Date: 06/24/2019 CLINICAL DATA:  Pain, swelling and redness in the elbow region. History of IV drug abuse. EXAM: MRI OF THE RIGHT ELBOW WITHOUT AND WITH CONTRAST TECHNIQUE: Multiplanar, multisequence MR imaging of the elbow was performed before and after the administration of intravenous contrast. CONTRAST:  7mL GADAVIST GADOBUTROL 1 MMOL/ML IV SOLN COMPARISON:  Radiographs 06/23/2019 FINDINGS: Extensive subcutaneous soft tissue swelling/edema/fluid consistent with severe cellulitis. There is also a rim enhancing fluid collection in the antecubital fossa measuring 3.0 x 2.2 x 1.9 cm consistent with a focal subcutaneous abscess. Mild adjacent myositis involving the pronator teres, brachialis and brachioradialis muscles. No findings for pyomyositis. No evidence of septic arthritis at the elbow joint and no findings for osteomyelitis. The radial and ulnar collateral ligaments are intact and the biceps, brachialis and triceps tendons are intact. IMPRESSION: 1. Diffuse cellulitis and 3.0 x 2.2 x 1.9 cm abscess in the subcutaneous tissues of the antecubital fossa. 2. Mild adjacent myositis but no findings for pyomyositis. 3. No MR findings to suggest septic arthritis or osteomyelitis. Electronically Signed   By: Rudie MeyerP.   Gallerani M.D.   On: 06/24/2019 09:03   [place] Venous Img Upper Uni Right  Result Date: 06/23/2019 CLINICAL DATA:  Pain, swelling, infection, IV drug use EXAM: RIGHT UPPER EXTREMITY VENOUS DOPPLER ULTRASOUND TECHNIQUE: Gray-scale sonography with graded compression, as well as color Doppler and duplex ultrasound were performed to evaluate the upper extremity deep venous system from the level of the subclavian vein and including the jugular, axillary, basilic, radial, ulnar and upper cephalic vein. Spectral Doppler was utilized to evaluate flow at rest and with distal augmentation maneuvers. COMPARISON:  None FINDINGS: Contralateral Subclavian Vein: Respiratory phasicity is normal and symmetric with the symptomatic side. No evidence of thrombus. Normal compressibility. Internal Jugular Vein: No evidence of thrombus. Normal compressibility, respiratory phasicity and response to augmentation. Subclavian Vein: No evidence of thrombus. Normal compressibility, respiratory phasicity and response to augmentation. Axillary Vein: No evidence of thrombus. Normal compressibility, respiratory phasicity and response to augmentation. Cephalic Vein: No evidence of thrombus. Normal compressibility, respiratory phasicity and response to augmentation. Basilic Vein: No evidence of thrombus. Normal  compressibility, respiratory phasicity and response to augmentation. Brachial Veins: No evidence of thrombus. Normal compressibility, respiratory phasicity and response to augmentation. Radial Veins: No evidence of thrombus. Normal compressibility, respiratory phasicity and response to augmentation. Ulnar Veins: No evidence of thrombus. Normal compressibility, respiratory phasicity and response to augmentation. Venous Reflux:  None visualized. Other Findings: Complicated hypoechoic collection identified at site of pain at RIGHT antecubital fossa, 3.6 x 2.3 x 32.8 cm in size. Collection contains heterogeneous predominately hypoechoic material  with scattered isoechoic foci. This could either represent an abscess or a hematoma. No surrounding hypervascularity on color Doppler imaging. IMPRESSION: No evidence of deep venous thrombosis in the RIGHT upper extremity. Complicated hypoechoic collection at RIGHT antecubital fossa 3.6 x 2.3 x 2.8 cm question abscess versus hematoma. Electronically Signed   By: Lavonia Dana M.D.   On: 06/23/2019 17:53    Assessment/Plan: Day of Surgery   Active Problems:   Cellulitis of right elbow  I have discussed the case via text with our hand specialist Dr. Peggye Ley who I sent a copy of MRI to.  She suggested draining the abscess in IR.   I also texted Dr. Posey Pronto from radiology who agreed that aspiration under ultrasound guidance could be performed in specials tomorrow.   This procedure has been ordered.  Patient will be allowed to eat.  Continue IV antibiotics per medicine.      Thornton Park , MD 06/24/2019, 1:16 PM

## 2019-06-24 NOTE — Progress Notes (Signed)
PROGRESS NOTE    Joel [name]  CHE:[id number] DOB: May 23, 1991 DOA: 06/23/2019 PCP: Joel [name]., MD   Brief Narrative: 28 year old gentleman with past medical history significant for hep C and [name] drug use was admitted due to right elbow cellulitis and abscess. Patient started infection after injecting couple of days ago.  No sign of systemic infection.  Orthopedic was consulted and plan is to do I&D with IR tomorrow.  Assessment & Plan:   Active Problems:   Cellulitis of right elbow  Right elbow cellulitis with abscess formation.  MRI was positive for an abscess formation. No other sign of systemic infection. -He is scheduled to get I&D with IR tomorrow. -Continue broad-spectrum antibiotics for now.  Tobacco abuse.  Continue nicotine patch.  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 -Will need counseling.  DVT prophylaxis: Lovenox Code Status: Full  family Communication: Discussed with patient Disposition Plan: Discharge 1 to 2 days pending improvement.  Consultants:   Orthopedic  Antimicrobials: Zosyn-day 2 Vancomycin-day 2  Subjective: Patient was feeling better when seen this morning.  Continues to experience 8/ 10 pain with any movement around right elbow.  Objective: Vitals:   06/24/19 0813 06/24/19 1541 06/24/19 1758 06/24/19 1815  BP: 124/71 124/84 (!) 127/97 122/72  Pulse: 84 88 72 71  Resp: 16 17 16 18   Temp: 98.6 F (37 C) 98.3 F (36.8 C) 98.1 F (36.7 C) 98 F (36.7 C)  TempSrc: Oral Oral Oral Oral  SpO2: 100% 99% 100% 100%  Weight:      Height:        Intake/Output Summary (Last 24 hours) at 06/24/2019 2024 Last data filed at 06/24/2019 1738 Gross per 24 hour  Intake 345.39 ml  Output --  Net 345.39 ml   Filed Weights   06/23/19 1613  Weight: 72.6 kg    Examination:  General exam: Appears calm and comfortable  Respiratory system: Clear to auscultation. Respiratory effort normal. Cardiovascular  system: S1 & S2 heard, RRR. No JVD, murmurs, rubs, gallops or clicks. No pedal edema. Gastrointestinal system: Abdomen is nondistended, soft and nontender. No organomegaly or masses felt. Normal bowel sounds heard. Central nervous system: Alert and oriented. No focal neurological deficits. Extremities: Edema and erythema around right elbow.  Pulses intact and symmetrical Psychiatry: Judgement and insight appear normal. Mood & affect appropriate.    Data Reviewed: I have personally reviewed following labs and imaging studies  CBC: Recent Labs  Lab 06/23/19 1659 06/23/19 2306 06/24/19 0637  WBC 12.8* 11.7* 7.2  NEUTROABS 9.8*  --   --   HGB 14.0 11.9* 12.6*  HCT 41.0 35.9* 37.4*  MCV 86.7 88.4 87.2  PLT 285 241 248   Basic Metabolic Panel: Recent Labs  Lab 06/23/19 1623 06/23/19 2206 06/24/19 0637  NA 136  --  136  K 4.2  --  3.9  CL 100  --  101  CO2 27  --  27  GLUCOSE 131*  --  102*  BUN 13  --  10  CREATININE 0.90 <0.30* 0.80  CALCIUM 8.6*  --  8.3*   GFR: Estimated Creatinine Clearance: 135 mL/min (by C-G formula based on SCr of 0.8 mg/dL). Liver Function Tests: Recent Labs  Lab 06/23/19 1623  AST 27  ALT 19  ALKPHOS 78  BILITOT 1.6*  PROT 7.2  ALBUMIN 4.2   No results for input(s): LIPASE, AMYLASE in the last 168 hours. No results for input(s): AMMONIA in the  last 168 hours. Coagulation Profile: No results for input(s): INR, PROTIME in the last 168 hours. Cardiac Enzymes: No results for input(s): CKTOTAL, CKMB, CKMBINDEX, TROPONINI in the last 168 hours. BNP (last 3 results) No results for input(s): PROBNP in the last 8760 hours. HbA1C: No results for input(s): HGBA1C in the last 72 hours. CBG: No results for input(s): GLUCAP in the last 168 hours. Lipid Profile: No results for input(s): CHOL, HDL, LDLCALC, TRIG, CHOLHDL, LDLDIRECT in the last 72 hours. Thyroid Function Tests: No results for input(s): TSH, T4TOTAL, FREET4, T3FREE, THYROIDAB in the  last 72 hours. Anemia Panel: No results for input(s): VITAMINB12, FOLATE, FERRITIN, TIBC, IRON, RETICCTPCT in the last 72 hours. Sepsis Labs: Recent Labs  Lab 06/23/19 1623  LATICACIDVEN 0.9    Recent Results (from the past 240 hour(s))  Blood culture (routine x 2)     Status: None (Preliminary result)   Collection Time: 06/23/19  6:52 PM   Specimen: BLOOD  Result Value Ref Range Status   Specimen Description BLOOD L FA  Final   Special Requests   Final    BOTTLES DRAWN AEROBIC AND ANAEROBIC Blood Culture adequate volume   Culture   Final    NO GROWTH < 24 HOURS Performed at [facility], [address], [place], [place] [address]    Report Status PENDING  Incomplete  Blood culture (routine x 2)     Status: None (Preliminary result)   Collection Time: 06/23/19  6:53 PM   Specimen: BLOOD  Result Value Ref Range Status   Specimen Description BLOOD L HAND  Final   Special Requests   Final    BOTTLES DRAWN AEROBIC AND ANAEROBIC Blood Culture results may not be optimal due to an inadequate volume of blood received in culture bottles   Culture   Final    NO GROWTH < 24 HOURS Performed at [facility], [address] [place], [place] [address]    Report Status PENDING  Incomplete  SARS CORONAVIRUS 2 (TAT 6-24 HRS) Nasopharyngeal Nasopharyngeal Swab     Status: None   Collection Time: 06/23/19  6:53 PM   Specimen: Nasopharyngeal Swab  Result Value Ref Range Status   SARS Coronavirus 2 NEGATIVE NEGATIVE Final    Comment: (NOTE) SARS-CoV-2 target nucleic acids are NOT DETECTED. The SARS-CoV-2 RNA is generally detectable in upper and lower respiratory specimens during the acute phase of infection. Negative results do not preclude SARS-CoV-2 infection, do not rule out co-infections with other pathogens, and should not be used as the sole basis for treatment or other patient management decisions. Negative results must be combined with clinical  observations, patient history, and epidemiological information. The expected result is Negative. Fact Sheet for Patients: HairSlick.no Fact Sheet for Healthcare Providers: quierodirigir.com This test is not yet approved or cleared by the [place] FDA and  has been authorized for detection and/or diagnosis of SARS-CoV-2 by FDA under an Emergency Use Authorization (EUA). This EUA will remain  in effect (meaning this test can be used) for the duration of the COVID-19 declaration under Section 56 4(b)(1) of the Act, 21 U.S.C. section 360bbb-3(b)(1), unless the authorization is terminated or revoked sooner. Performed at [facility], [address], [place], [place] [address]      Radiology Studies: Dg Elbow Complete Right  Result Date: 06/23/2019 CLINICAL DATA:  Abscess in right forearm and elbow region. EXAM: RIGHT ELBOW - COMPLETE 3+ VIEW COMPARISON:  None. FINDINGS: No fracture or dislocation.  No joint effusion. No foreign bodies are seen in the soft tissues. No soft tissue gas identified. IMPRESSION: Negative. Electronically Signed   By: Gerome Samavid  Williams III M.D   On: 06/23/2019 17:50   Mr Elbow Right W Wo Contrast  Result Date: 06/24/2019 CLINICAL DATA:  Pain, swelling and redness in the elbow region. History of [name] drug abuse. EXAM: MRI OF THE RIGHT ELBOW WITHOUT AND WITH CONTRAST TECHNIQUE: Multiplanar, multisequence MR imaging of the elbow was performed before and after the administration of intravenous contrast. CONTRAST:  7mL GADAVIST GADOBUTROL 1 MMOL/ML [name] SOLN COMPARISON:  Radiographs 06/23/2019 FINDINGS: Extensive subcutaneous soft tissue swelling/edema/fluid consistent with severe cellulitis. There is also a rim enhancing fluid collection in the antecubital fossa measuring 3.0 x 2.2 x 1.9 cm consistent with a focal subcutaneous abscess. Mild adjacent myositis involving the pronator teres, brachialis and  brachioradialis muscles. No findings for pyomyositis. No evidence of septic arthritis at the elbow joint and no findings for osteomyelitis. The radial and ulnar collateral ligaments are intact and the biceps, brachialis and triceps tendons are intact. IMPRESSION: 1. Diffuse cellulitis and 3.0 x 2.2 x 1.9 cm abscess in the subcutaneous tissues of the antecubital fossa. 2. Mild adjacent myositis but no findings for pyomyositis. 3. No MR findings to suggest septic arthritis or osteomyelitis. Electronically Signed   By: Rudie MeyerP.  Gallerani M.D.   On: 06/24/2019 09:03   [place] Venous Img Upper Uni Right  Result Date: 06/23/2019 CLINICAL DATA:  Pain, swelling, infection, [name] drug use EXAM: RIGHT UPPER EXTREMITY VENOUS DOPPLER ULTRASOUND TECHNIQUE: Gray-scale sonography with graded compression, as well as color Doppler and duplex ultrasound were performed to evaluate the upper extremity deep venous system from the level of the subclavian vein and including the jugular, axillary, basilic, radial, ulnar and upper cephalic vein. Spectral Doppler was utilized to evaluate flow at rest and with distal augmentation maneuvers. COMPARISON:  None FINDINGS: Contralateral Subclavian Vein: Respiratory phasicity is normal and symmetric with the symptomatic side. No evidence of thrombus. Normal compressibility. Internal Jugular Vein: No evidence of thrombus. Normal compressibility, respiratory phasicity and response to augmentation. Subclavian Vein: No evidence of thrombus. Normal compressibility, respiratory phasicity and response to augmentation. Axillary Vein: No evidence of thrombus. Normal compressibility, respiratory phasicity and response to augmentation. Cephalic Vein: No evidence of thrombus. Normal compressibility, respiratory phasicity and response to augmentation. Basilic Vein: No evidence of thrombus. Normal compressibility, respiratory phasicity and response to augmentation. Brachial Veins: No evidence of thrombus. Normal  compressibility, respiratory phasicity and response to augmentation. Radial Veins: No evidence of thrombus. Normal compressibility, respiratory phasicity and response to augmentation. Ulnar Veins: No evidence of thrombus. Normal compressibility, respiratory phasicity and response to augmentation. Venous Reflux:  None visualized. Other Findings: Complicated hypoechoic collection identified at site of pain at RIGHT antecubital fossa, 3.6 x 2.3 x 32.8 cm in size. Collection contains heterogeneous predominately hypoechoic material with scattered isoechoic foci. This could either represent an abscess or a hematoma. No surrounding hypervascularity on color Doppler imaging. IMPRESSION: No evidence of deep venous thrombosis in the RIGHT upper extremity. Complicated hypoechoic collection at RIGHT antecubital fossa 3.6 x 2.3 x 2.8 cm question abscess versus hematoma. Electronically Signed   By: Ulyses SouthwardMark  Boles M.D.   On: 06/23/2019 17:53   Scheduled Meds:  enoxaparin (LOVENOX) injection  40 mg Subcutaneous Q24H   nicotine  21 mg Transdermal Daily   Continuous Infusions:  piperacillin-tazobactam (ZOSYN)  [name] Stopped (06/24/19 1738)   vancomycin Stopped (06/24/19 1050)     LOS:  1 day   Time spent: 45 minutes  Lorella Nimrod, MD [facility] Pager [phone]  If 7PM-7AM, please contact night-coverage [email or web address] Password [facility] 06/24/2019, 8:24 PM

## 2019-06-24 NOTE — ED Notes (Signed)
Pt given lunch tray per Dr. Mack Guise.

## 2019-06-24 NOTE — ED Notes (Signed)
Pt resting comfortable upon RN entrance, Rn informed pt urine test still need it, pt "may be after I eat I give you one" RN informed pt that orders are NPO (nothing by mouth) for surgical consult, pt verbalizes understanding and replied he will try to give a sample, RN informed pt will come to check on sample in 15 minutes

## 2019-06-25 ENCOUNTER — Inpatient Hospital Stay: Payer: BLUE CROSS/BLUE SHIELD

## 2019-06-25 LAB — CREATININE, SERUM
Creatinine, Ser: 0.69 mg/dL (ref 0.61–1.24)
GFR calc Af Amer: >60 mL/min (ref >60)
GFR calc non Af Amer: >60 mL/min (ref >60)

## 2019-06-25 MED ORDER — DOXYCYCLINE HYCLATE 100 MG PO TABS: 100.0000 mg | ORAL_TABLET | Freq: Two times a day (BID) | ORAL | 0 refills | Status: AC

## 2019-06-25 NOTE — Discharge Summary (Signed)
Physician Discharge Summary  Joel [name] VFI:[id number] DOB: [birth year] DOA: 06/23/2019  PCP: Jerrol Banana., MD  Admit date: 06/23/2019 Discharge date: 06/25/2019  Admitted From: Home Disposition: Home  Recommendations for Outpatient Follow-up:  1. Follow up with PCP in 1-2 weeks 2. Please obtain BMP/CBC in one week 3. Please follow up on the following pending results:  Home Health: No Equipment/Devices:None Discharge Condition: Stable CODE STATUS: Full Diet recommendation: Regular  Brief/Interim Summary: 28 year old gentleman with past medical history significant for hep C and [name] drug use was admitted due to right elbow cellulitis and abscess. Patient started infection after injecting couple of days ago.  No sign of systemic infection.  Orthopedic was consulted and he obtain I&D with IR. Patient is feeling better after I&D.  He wants to be discharged. He was experiencing mild withdrawal symptoms but refused to take Suboxone or any other help stating that he will get over with this by himself. We discussed in detail about getting help and how Suboxone can help staying sober.  Patient seems understanding stating that he will get help if needed. He was initially treated with vancomycin and Zosyn in hospital and was discharged on doxycycline for 5 more days.  Discharge Diagnoses:  Active Problems:   Cellulitis of right elbow   [name] drug user   Abscess of arm, right  Discharge Instructions  Discharge Instructions    Call MD for:  redness, tenderness, or signs of infection (pain, swelling, redness, odor or green/yellow discharge around incision site)   Complete by: As directed    Call MD for:  severe uncontrolled pain   Complete by: As directed    Call MD for:  temperature >100.4   Complete by: As directed    Diet - low sodium heart healthy   Complete by: As directed    Discharge instructions   Complete by: As directed    It was pleasure taking care of  you. As we discussed it is very important that you stay away from [name] drugs. We did discussed going on Suboxone and my understanding is that you do not want to start at this time and will try to stay away from drugs on your own.  That approach can be quite uncomfortable.  If you feel that you need help please contact your PCP so they can arrange Suboxone clinic for you. Also make an appointment with your PCP to be seen within 1 week to make sure the resolution of your infection. Please take doxycycline for 5 days.   Increase activity slowly   Complete by: As directed      Allergies as of 06/25/2019      Reactions   Poison Ivy Extract Itching   Sertraline Hcl Itching      Medication List    TAKE these medications   doxycycline 100 MG tablet Commonly known as: VIBRA-TABS Take 1 tablet (100 mg total) by mouth 2 (two) times daily for 5 days.      Follow-up Information    Jerrol Banana., MD. Schedule an appointment as soon as possible for a visit.   Specialty: Family Medicine Why: Please make an appointment to be seen within 1 week. Contact information: [address] [place] [address] [phone]          Allergies  Allergen Reactions  . Poison Ivy Extract Itching  . Sertraline Hcl Itching    Consultations: Orthopedic IR  Procedures/Studies: Dg Elbow Complete Right  Result  Date: 06/23/2019 CLINICAL DATA:  Abscess in right forearm and elbow region. EXAM: RIGHT ELBOW - COMPLETE 3+ VIEW COMPARISON:  None. FINDINGS: No fracture or dislocation. No joint effusion. No foreign bodies are seen in the soft tissues. No soft tissue gas identified. IMPRESSION: Negative. Electronically Signed   By: Gerome Sam III M.D   On: 06/23/2019 17:50   Mr Elbow Right W Wo Contrast  Result Date: 06/24/2019 CLINICAL DATA:  Pain, swelling and redness in the elbow region. History of [name] drug abuse. EXAM: MRI OF THE RIGHT ELBOW WITHOUT AND WITH CONTRAST TECHNIQUE:  Multiplanar, multisequence MR imaging of the elbow was performed before and after the administration of intravenous contrast. CONTRAST:  7mL GADAVIST GADOBUTROL 1 MMOL/ML [name] SOLN COMPARISON:  Radiographs 06/23/2019 FINDINGS: Extensive subcutaneous soft tissue swelling/edema/fluid consistent with severe cellulitis. There is also a rim enhancing fluid collection in the antecubital fossa measuring 3.0 x 2.2 x 1.9 cm consistent with a focal subcutaneous abscess. Mild adjacent myositis involving the pronator teres, brachialis and brachioradialis muscles. No findings for pyomyositis. No evidence of septic arthritis at the elbow joint and no findings for osteomyelitis. The radial and ulnar collateral ligaments are intact and the biceps, brachialis and triceps tendons are intact. IMPRESSION: 1. Diffuse cellulitis and 3.0 x 2.2 x 1.9 cm abscess in the subcutaneous tissues of the antecubital fossa. 2. Mild adjacent myositis but no findings for pyomyositis. 3. No MR findings to suggest septic arthritis or osteomyelitis. Electronically Signed   By: Rudie Meyer M.D.   On: 06/24/2019 09:03   US Venous Img Upper Uni Right  Result Date: 06/23/2019 CLINICAL DATA:  Pain, swelling, infection, [name] drug use EXAM: RIGHT UPPER EXTREMITY VENOUS DOPPLER ULTRASOUND TECHNIQUE: Gray-scale sonography with graded compression, as well as color Doppler and duplex ultrasound were performed to evaluate the upper extremity deep venous system from the level of the subclavian vein and including the jugular, axillary, basilic, radial, ulnar and upper cephalic vein. Spectral Doppler was utilized to evaluate flow at rest and with distal augmentation maneuvers. COMPARISON:  None FINDINGS: Contralateral Subclavian Vein: Respiratory phasicity is normal and symmetric with the symptomatic side. No evidence of thrombus. Normal compressibility. Internal Jugular Vein: No evidence of thrombus. Normal compressibility, respiratory phasicity and response to  augmentation. Subclavian Vein: No evidence of thrombus. Normal compressibility, respiratory phasicity and response to augmentation. Axillary Vein: No evidence of thrombus. Normal compressibility, respiratory phasicity and response to augmentation. Cephalic Vein: No evidence of thrombus. Normal compressibility, respiratory phasicity and response to augmentation. Basilic Vein: No evidence of thrombus. Normal compressibility, respiratory phasicity and response to augmentation. Brachial Veins: No evidence of thrombus. Normal compressibility, respiratory phasicity and response to augmentation. Radial Veins: No evidence of thrombus. Normal compressibility, respiratory phasicity and response to augmentation. Ulnar Veins: No evidence of thrombus. Normal compressibility, respiratory phasicity and response to augmentation. Venous Reflux:  None visualized. Other Findings: Complicated hypoechoic collection identified at site of pain at RIGHT antecubital fossa, 3.6 x 2.3 x 32.8 cm in size. Collection contains heterogeneous predominately hypoechoic material with scattered isoechoic foci. This could either represent an abscess or a hematoma. No surrounding hypervascularity on color Doppler imaging. IMPRESSION: No evidence of deep venous thrombosis in the RIGHT upper extremity. Complicated hypoechoic collection at RIGHT antecubital fossa 3.6 x 2.3 x 2.8 cm question abscess versus hematoma. Electronically Signed   By: Ulyses Southward M.D.   On: 06/23/2019 17:53   [place] Drain/inj Major Joint/bursa  Result Date: 06/25/2019 INDICATION: 28 year old with right arm swelling. MRI  demonstrates diffuse cellulitis with an abscess in the right antecubital fossa region. Patient presents for ultrasound-guided aspiration. EXAM: ULTRASOUND-GUIDED ASPIRATION OF RIGHT UPPER EXTREMITY ABSCESS MEDICATIONS: Local anesthetic ANESTHESIA/SEDATION: Local anesthetic COMPLICATIONS: None immediate. PROCEDURE: Informed written consent was obtained from the  patient after a thorough discussion of the procedural risks, benefits and alternatives. All questions were addressed. A timeout was performed prior to the initiation of the procedure. The right elbow antecubital region was evaluated with ultrasound. A superficial complex fluid collection was identified. The skin was prepped with chlorhexidine and sterile field was created. Skin was anesthetized with 1% lidocaine. Using ultrasound guidance, a Yueh catheter was directed into the collection and approximately 4 mL of yellow purulent fluid was aspirated. Bandage placed over the puncture site. FINDINGS: Complex fluid collection in the right antecubital fossa region. Yueh catheter was directed into the collection and approximately 4 mL of yellow purulent fluid was removed. The collection appeared to be decompressed by the end of the procedure. IMPRESSION: Successful ultrasound-guided aspiration of the small abscess in the right antecubital region of the right upper extremity. Electronically Signed   By: Richarda Overlie M.D.   On: 06/25/2019 09:49    Discharge Exam: Vitals:   06/24/19 2025 06/25/19 0437  BP: 123/71 124/82  Pulse: 76 78  Resp: 18 16  Temp: 98.8 F (37.1 C) 98.7 F (37.1 C)  SpO2: 99% 100%   Vitals:   06/24/19 1758 06/24/19 1815 06/24/19 2025 06/25/19 0437  BP: (!) 127/97 122/72 123/71 124/82  Pulse: 72 71 76 78  Resp: Temp: 98.1 F (36.7 C) 98 F (36.7 C) 98.8 F (37.1 C) 98.7 F (37.1 C)  TempSrc: Oral Oral Oral   SpO2: 100% 100% 99% 100%  Weight:      Height:        General: Pt is alert, awake, not in acute distress Cardiovascular: RRR, S1/S2 +, no rubs, no gallops Respiratory: CTA bilaterally, no wheezing, no rhonchi Abdominal: Soft, NT, ND, bowel sounds + Extremities: no edema, no cyanosis.  Clean bandage was in place on medial side of right elbow.    The results of significant diagnostics from this hospitalization (including imaging, microbiology, ancillary  and laboratory) are listed below for reference.     Microbiology: Recent Results (from the past 240 hour(s))  Blood culture (routine x 2)     Status: None (Preliminary result)   Collection Time: 06/23/19  6:52 PM   Specimen: BLOOD  Result Value Ref Range Status   Specimen Description BLOOD L FA  Final   Special Requests   Final    BOTTLES DRAWN AEROBIC AND ANAEROBIC Blood Culture adequate volume   Culture   Final    NO GROWTH 2 DAYS Performed at [facility], [address] [place], [place] [address]    Report Status PENDING  Incomplete  Blood culture (routine x 2)     Status: None (Preliminary result)   Collection Time: 06/23/19  6:53 PM   Specimen: BLOOD  Result Value Ref Range Status   Specimen Description BLOOD L HAND  Final   Special Requests   Final    BOTTLES DRAWN AEROBIC AND ANAEROBIC Blood Culture results may not be optimal due to an inadequate volume of blood received in culture bottles   Culture   Final    NO GROWTH 2 DAYS Performed at [facility], [address], [place], [place] [address]    Report Status PENDING  Incomplete  SARS CORONAVIRUS 2 (TAT 6-24 HRS) Nasopharyngeal Nasopharyngeal Swab     Status: None   Collection Time: 06/23/19  6:53 PM   Specimen: Nasopharyngeal Swab  Result Value Ref Range Status   SARS Coronavirus 2 NEGATIVE NEGATIVE Final    Comment: (NOTE) SARS-CoV-2 target nucleic acids are NOT DETECTED. The SARS-CoV-2 RNA is generally detectable in upper and lower respiratory specimens during the acute phase of infection. Negative results do not preclude SARS-CoV-2 infection, do not rule out co-infections with other pathogens, and should not be used as the sole basis for treatment or other patient management decisions. Negative results must be combined with clinical observations, patient history, and epidemiological information. The expected result is Negative. Fact Sheet for  Patients: HairSlick.no Fact Sheet for Healthcare Providers: quierodirigir.com This test is not yet approved or cleared by the [place] FDA and  has been authorized for detection and/or diagnosis of SARS-CoV-2 by FDA under an Emergency Use Authorization (EUA). This EUA will remain  in effect (meaning this test can be used) for the duration of the COVID-19 declaration under Section 56 4(b)(1) of the Act, 21 U.S.C. section 360bbb-3(b)(1), unless the authorization is terminated or revoked sooner. Performed at [facility], [address] [place], [place] [address]   Aerobic/Anaerobic Culture (surgical/deep wound)     Status: None (Preliminary result)   Collection Time: 06/25/19  8:57 AM   Specimen: Abscess  Result Value Ref Range Status   Specimen Description   Final    ABSCESS RIGHT ARM Performed at [facility], [address] [place], [place] [address]    Special Requests   Final    NONE Performed at [facility], [address] [place], [place] [address]    Gram Stain PENDING  Incomplete   Culture PENDING  Incomplete   Report Status PENDING  Incomplete     Labs: BNP (last 3 results) No results for input(s): BNP in the last 8760 hours. Basic Metabolic Panel: Recent Labs  Lab 06/23/19 1623 06/23/19 2206 06/24/19 0637 06/25/19 0516  NA 136  --  136  --   K 4.2  --  3.9  --   CL 100  --  101  --   CO2 27  --  27  --   GLUCOSE 131*  --  102*  --   BUN 13  --  10  --   CREATININE 0.90 <0.30* 0.80 0.69  CALCIUM 8.6*  --  8.3*  --    Liver Function Tests: Recent Labs  Lab 06/23/19 1623  AST 27  ALT 19  ALKPHOS 78  BILITOT 1.6*  PROT 7.2  ALBUMIN 4.2   No results for input(s): LIPASE, AMYLASE in the last 168 hours. No results for input(s): AMMONIA in the last 168 hours. CBC: Recent Labs  Lab 06/23/19 1659 06/23/19 2306 06/24/19 0637  WBC 12.8* 11.7* 7.2  NEUTROABS 9.8*  --    --   HGB 14.0 11.9* 12.6*  HCT 41.0 35.9* 37.4*  MCV 86.7 88.4 87.2  PLT 285 241 248   Cardiac Enzymes: No results for input(s): CKTOTAL, CKMB, CKMBINDEX, TROPONINI in the last 168 hours. BNP: Invalid input(s): POCBNP CBG: No results for input(s): GLUCAP in the last 168 hours. D-Dimer No results for input(s): DDIMER in the last 72 hours. Hgb A1c No results for input(s): HGBA1C in the last 72 hours. Lipid Profile No results for input(s): CHOL, HDL, LDLCALC, TRIG, CHOLHDL, LDLDIRECT in the last 72 hours.  Thyroid function studies No results for input(s): TSH, T4TOTAL, T3FREE, THYROIDAB in the last 72 hours.  Invalid input(s): FREET3 Anemia work up No results for input(s): VITAMINB12, FOLATE, FERRITIN, TIBC, IRON, RETICCTPCT in the last 72 hours. Urinalysis    Component Value Date/Time   COLORURINE YELLOW 07/29/2018 1958   APPEARANCEUR HAZY (A) 07/29/2018 1958   APPEARANCEUR Clear 05/02/2014 0142   LABSPEC 1.017 07/29/2018 1958   LABSPEC 1.009 05/02/2014 0142   PHURINE 7.0 07/29/2018 1958   GLUCOSEU NEGATIVE 07/29/2018 1958   GLUCOSEU Negative 05/02/2014 0142   HGBUR NEGATIVE 07/29/2018 1958   BILIRUBINUR NEGATIVE 07/29/2018 1958   BILIRUBINUR Negative 05/02/2014 0142   KETONESUR 5 (A) 07/29/2018 1958   PROTEINUR NEGATIVE 07/29/2018 1958   NITRITE NEGATIVE 07/29/2018 1958   LEUKOCYTESUR NEGATIVE 07/29/2018 1958   LEUKOCYTESUR Trace 05/02/2014 0142   Sepsis Labs Invalid input(s): PROCALCITONIN,  WBC,  LACTICIDVEN Microbiology Recent Results (from the past 240 hour(s))  Blood culture (routine x 2)     Status: None (Preliminary result)   Collection Time: 06/23/19  6:52 PM   Specimen: BLOOD  Result Value Ref Range Status   Specimen Description BLOOD L FA  Final   Special Requests   Final    BOTTLES DRAWN AEROBIC AND ANAEROBIC Blood Culture adequate volume   Culture   Final    NO GROWTH 2 DAYS Performed at [facility], [address] [place], [place]  [address]    Report Status PENDING  Incomplete  Blood culture (routine x 2)     Status: None (Preliminary result)   Collection Time: 06/23/19  6:53 PM   Specimen: BLOOD  Result Value Ref Range Status   Specimen Description BLOOD L HAND  Final   Special Requests   Final    BOTTLES DRAWN AEROBIC AND ANAEROBIC Blood Culture results may not be optimal due to an inadequate volume of blood received in culture bottles   Culture   Final    NO GROWTH 2 DAYS Performed at [facility], [address] [place], [place] [address]    Report Status PENDING  Incomplete  SARS CORONAVIRUS 2 (TAT 6-24 HRS) Nasopharyngeal Nasopharyngeal Swab     Status: None   Collection Time: 06/23/19  6:53 PM   Specimen: Nasopharyngeal Swab  Result Value Ref Range Status   SARS Coronavirus 2 NEGATIVE NEGATIVE Final    Comment: (NOTE) SARS-CoV-2 target nucleic acids are NOT DETECTED. The SARS-CoV-2 RNA is generally detectable in upper and lower respiratory specimens during the acute phase of infection. Negative results do not preclude SARS-CoV-2 infection, do not rule out co-infections with other pathogens, and should not be used as the sole basis for treatment or other patient management decisions. Negative results must be combined with clinical observations, patient history, and epidemiological information. The expected result is Negative. Fact Sheet for Patients: HairSlick.nohttps://www.fda.gov/media/138098/download Fact Sheet for Healthcare Providers: quierodirigir.comhttps://www.fda.gov/media/138095/download This test is not yet approved or cleared by the [place] FDA and  has been authorized for detection and/or diagnosis of SARS-CoV-2 by FDA under an Emergency Use Authorization (EUA). This EUA will remain  in effect (meaning this test can be used) for the duration of the COVID-19 declaration under Section 56 4(b)(1) of the Act, 21 U.S.C. section 360bbb-3(b)(1), unless the authorization is terminated or revoked  sooner. Performed at [facility], [address] [place], [place] [address]   Aerobic/Anaerobic Culture (surgical/deep wound)     Status: None (Preliminary result)   Collection Time: 06/25/19  8:57 AM   Specimen: Abscess  Result Value Ref Range Status   Specimen Description   Final    ABSCESS RIGHT ARM Performed at [facility], [address], [place], [place] [address]    Special Requests   Final    NONE Performed at [facility], [address] [place], [place] [address]    Gram Stain PENDING  Incomplete   Culture PENDING  Incomplete   Report Status PENDING  Incomplete    Time coordinating discharge: Over 30 minutes  SIGNED:  Arnetha Courser, MD  [facility] 06/25/2019, 12:31 PM Pager [phone]  If 7PM-7AM, please contact night-coverage [email or web address] Password [email or web address]

## 2019-06-25 NOTE — TOC Transition Note (Signed)
Transition of Care [facility]) - CM/SW Discharge Note   Patient Details  Name: Mavrick Mcquigg MRN: [id number] Date of Birth: [birth year]  [facility] CM/SW Contact:  Aarini Slee, Lenice Llamas Phone Number: [phone]  06/25/2019, 4:09 PM   Clinical Narrative: Per RN in progression rounds this morning patient has housing concerns. Clinical Education officer, museum (CSW) met with patient alone at bedside prior to his D/C home today. CSW introduced self and explained role of CSW department. Per patient he lives in [place] and has no housing concerns. Patient reported that he uses opiates but is not interested in substance abuse resources. Patient reported that he has no issues with transportation and has a ride home today. CSW left list of [facility] resources at bedside. Please reconsult if future social work needs arise. CSW signing off.     Final next level of care: Home/Self Care Barriers to Discharge: Barriers Resolved   Patient Goals and CMS Choice Patient states their goals for this hospitalization and ongoing recovery are:: To go home.      Discharge Placement                       Discharge Plan and Services                DME Arranged: N/A         HH Arranged: NA          Social Determinants of Health (SDOH) Interventions     Readmission Risk Interventions No flowsheet data found.

## 2019-06-25 NOTE — Progress Notes (Signed)
Pt d/c to home via friend. IV removed intact. Education completed.

## 2019-06-25 NOTE — Procedures (Signed)
Interventional Radiology Procedure:   Indications: Abscess in right upper extremity  Procedure: US guided abscess aspiration  Findings: Complex abscess in right antecubital region.  Aspirated 4 ml of yellow purulent fluid.  Complications: None     EBL: less than 10 ml  Plan: Send fluid for cultures.    Teira Arcilla R. Anselm Pancoast, MD  Pager: [phone]

## 2019-06-26 ENCOUNTER — Telehealth: Payer: Self-pay

## 2019-06-26 NOTE — Telephone Encounter (Signed)
No HFU scheduled.  

## 2019-06-26 NOTE — Telephone Encounter (Signed)
Spoke with pt who is requesting a CB after 12 today. Note made.

## 2019-06-26 NOTE — Telephone Encounter (Signed)
Called mother to reach pt and she gave me his new cell # [phone]. Mother requested a CB if pt does not answer. Mother is not listed on DPR.

## 2019-06-26 NOTE — Telephone Encounter (Signed)
LMTCB to complete TCM call.

## 2019-06-27 NOTE — Telephone Encounter (Signed)
I have made the 2nd attempt to contact the patient or family member in charge, in order to follow up from recently being discharged from the hospital. I left a message on voicemail requesting a CB. -MM  

## 2019-06-28 LAB — CULTURE, BLOOD (ROUTINE X 2)
Culture: NO GROWTH
Culture: NO GROWTH
Special Requests: ADEQUATE

## 2019-06-28 NOTE — Telephone Encounter (Signed)
Noted, thank you

## 2019-06-28 NOTE — Telephone Encounter (Signed)
Transition Care Management Follow-up Telephone Call  Date of discharge and from where: [facility] on 06/25/19  How have you been since you were released from the hospital? Doing good, arm seems to be better. Area is not swollen but has a raised bump coming to surface. The bump is red and about the size of a quarter. Only tender to touch. Declines warmth or drainage at site.  Any questions or concerns? No   Items Reviewed:  Did the pt receive and understand the discharge instructions provided? Yes   Medications obtained and verified? Yes   Any new allergies since your discharge? No   Dietary orders reviewed? N/A  Do you have support at home? Yes   Other (ie: DME, Home Health, etc) N/A  Functional Questionnaire: (I = Independent and D = Dependent)  Bathing/Dressing- I   Meal Prep- I  Eating- I  Maintaining continence- I  Transferring/Ambulation- I  Managing Meds- I   Follow up appointments reviewed:    PCP Hospital f/u appt confirmed? No , apt not scheduled yet. Schedule is currently full for the next 2-3 weeks. Message sent for f/u apt.   [facility] f/u appt confirmed? N/A  Are transportation arrangements needed? No   If their condition worsens, is the pt aware to call  their PCP or go to the ED? Yes  Was the patient provided with contact information for the PCP's office or ED? Yes  Was the pt encouraged to call back with questions or concerns? Yes

## 2019-06-28 NOTE — Telephone Encounter (Signed)
Scheduled patient with Vernie Murders tomorrow at 3:00 pm. Faythe Ghee per Simona Huh.

## 2019-06-29 ENCOUNTER — Ambulatory Visit (INDEPENDENT_AMBULATORY_CARE_PROVIDER_SITE_OTHER): Payer: BLUE CROSS/BLUE SHIELD | Admitting: Family Medicine

## 2019-06-29 ENCOUNTER — Encounter: Payer: Self-pay | Admitting: Family Medicine

## 2019-06-29 ENCOUNTER — Other Ambulatory Visit: Payer: Self-pay

## 2019-06-29 VITALS — BP 116/64 | HR 96 | Temp 98.1°F | Resp 16 | Ht 69.0 in | Wt 159.0 lb

## 2019-06-29 DIAGNOSIS — L03119 Cellulitis of unspecified part of limb: Secondary | ICD-10-CM

## 2019-06-29 NOTE — Progress Notes (Signed)
Patient: Joel [name] Male    DOB: Aug 26, 1990   28 y.o.   MRN: [id number] Visit Date: 06/29/2019  Today's Provider: Vernie Murders, PA   Chief Complaint  Patient presents with  . ER follow up   Subjective:     HPI  Follow Up ER Visit  Patient is here for ER follow up.  He was recently seen at [facility] for cellulitis of right elbow on 06/23/2019. Treatment for this included doxycycline at discharge He reports good compliance with treatment. He reports this condition is Improved. However, patient reports that he still does not have full range of motion in his right arm.   Past Medical History:  Diagnosis Date  . Enlarged lymph node 2014  . Polysubstance abuse ([place])   . Total bilirubin, elevated    Past Surgical History:  Procedure Laterality Date  . TONSILLECTOMY     No family history on file.  Allergies  Allergen Reactions  . Poison Ivy Extract Itching  . Sertraline Hcl Itching    Current Outpatient Medications:  .  doxycycline (VIBRA-TABS) 100 MG tablet, Take 1 tablet (100 mg total) by mouth 2 (two) times daily for 5 days., Disp: 20 tablet, Rfl: 0  Review of Systems  Constitutional: Negative for activity change and fatigue.  Musculoskeletal: Positive for arthralgias, joint swelling and myalgias.   Social History   Tobacco Use  . Smoking status: Current Every Day Smoker    Packs/day: 1.00    Years: 6.00    Pack years: 6.00    Types: Cigarettes  . Smokeless tobacco: Never Used  Substance Use Topics  . Alcohol use: Yes    Comment: every 3 days      Objective:   BP 116/64   Pulse 96   Temp 98.1 F (36.7 C)   Resp 16   Ht 5\' 9"  (1.753 m)   Wt 159 lb (72.1 kg)   SpO2 99%   BMI 23.48 kg/m  Vitals:   06/29/19 1517  BP: 116/64  Pulse: 96  Resp: 16  Temp: 98.1 F (36.7 C)  SpO2: 99%  Weight: 159 lb (72.1 kg)  Height: 5\' 9"  (1.753 m)  Body mass index is 23.48 kg/m.  Physical Exam Constitutional:      General: He is not in  acute distress.    Appearance: He is well-developed.  HENT:     Head: Normocephalic and atraumatic.     Right Ear: Hearing normal.     Left Ear: Hearing normal.     Nose: Nose normal.  Eyes:     General: Lids are normal. No scleral icterus.       Right eye: No discharge.        Left eye: No discharge.     Conjunctiva/sclera: Conjunctivae normal.  Cardiovascular:     Rate and Rhythm: Normal rate and regular rhythm.     Heart sounds: Normal heart sounds.  Pulmonary:     Effort: Pulmonary effort is normal. No respiratory distress.  Musculoskeletal: Normal range of motion.  Skin:    Findings: No lesion or rash.     Comments: 1.5 x 4 cm erythema with surrounding induration and soreness in the right antecubital fossa. Approximate 25-30 degree loss of full extension. Good pulses without local lymphadenopathy or lymphangitis. No swelling.  Neurological:     Mental Status: He is alert and oriented to person, place, and time.  Psychiatric:        Speech: Speech normal.  Behavior: Behavior normal.        Thought Content: Thought content normal.       Assessment & Plan    1. Cellulitis of antecubital fossa Hospitalized 06-23-19 and discharged 06-25-19 for right antecubital fossa cellulitis in an [name] drug use history (drug screen positive for amphetamines and opiates) with last injection at this site 06-20-19. Was treated with [name] Vancomycin and Zosyn. Orthopedist aspirated the abscess at the right medial bicep insertion site. Blood cultures were negative but aspirate from abscess grew an abundant amount of streptococcus intermedius but no anaerobes. Sensitivity test only showed resistance to Erythromycin. He was discharged on Doxycycline and is feeling much better with swelling resolving. Elbow still feels a little stiff today with a 25-30 degree loss of full extension. Counseled regarding progressive stretching to regain full ROM. Recheck if no better when he finishes the Doxycycline or any  fever.     Dortha Kern, PA  [facility]

## 2019-06-30 LAB — AEROBIC/ANAEROBIC CULTURE W GRAM STAIN (SURGICAL/DEEP WOUND)

## 2020-02-01 ENCOUNTER — Other Ambulatory Visit: Payer: Self-pay

## 2020-02-01 ENCOUNTER — Emergency Department (HOSPITAL_COMMUNITY)
Admission: EM | Admit: 2020-02-01 | Discharge: 2020-02-01 | Discharge status: Against Medical Advice | Payer: Self-pay | Attending: Emergency Medicine | Admitting: Emergency Medicine

## 2020-02-01 ENCOUNTER — Emergency Department (HOSPITAL_COMMUNITY): Payer: Self-pay

## 2020-02-01 ENCOUNTER — Encounter (HOSPITAL_COMMUNITY): Payer: Self-pay

## 2020-02-01 DIAGNOSIS — Y9259 Other trade areas as the place of occurrence of the external cause: Secondary | ICD-10-CM | POA: Insufficient documentation

## 2020-02-01 DIAGNOSIS — F17210 Nicotine dependence, cigarettes, uncomplicated: Secondary | ICD-10-CM | POA: Insufficient documentation

## 2020-02-01 DIAGNOSIS — T07XXXA Unspecified multiple injuries, initial encounter: Secondary | ICD-10-CM

## 2020-02-01 DIAGNOSIS — S40211A Abrasion of right shoulder, initial encounter: Secondary | ICD-10-CM | POA: Insufficient documentation

## 2020-02-01 DIAGNOSIS — Z23 Encounter for immunization: Secondary | ICD-10-CM | POA: Insufficient documentation

## 2020-02-01 DIAGNOSIS — Y998 Other external cause status: Secondary | ICD-10-CM | POA: Insufficient documentation

## 2020-02-01 DIAGNOSIS — F1110 Opioid abuse, uncomplicated: Secondary | ICD-10-CM | POA: Insufficient documentation

## 2020-02-01 DIAGNOSIS — S5012XA Contusion of left forearm, initial encounter: Secondary | ICD-10-CM | POA: Insufficient documentation

## 2020-02-01 DIAGNOSIS — S0093XA Contusion of unspecified part of head, initial encounter: Secondary | ICD-10-CM | POA: Insufficient documentation

## 2020-02-01 DIAGNOSIS — Y9389 Activity, other specified: Secondary | ICD-10-CM | POA: Insufficient documentation

## 2020-02-01 MED ORDER — NALOXONE HCL 4 MG/0.1ML NA LIQD
1.0000 | Freq: Once | NASAL | Status: AC
  Administered 2020-02-01: 1 via NASAL
  Filled 2020-02-01: qty 4

## 2020-02-01 MED ORDER — TETANUS-DIPHTH-ACELL PERTUSSIS 5-2.5-18.5 LF-MCG/0.5 IM SUSP
0.5000 mL | Freq: Once | INTRAMUSCULAR | Status: AC
  Administered 2020-02-01: 0.5 mL via INTRAMUSCULAR
  Filled 2020-02-01: qty 0.5
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
## 2020-02-01 NOTE — Discharge Instructions (Addendum)
It was our pleasure to provide your ER care today - we hope that you feel better.  Keep abrasions very clean - wash with soap and water 2x/day.   Avoid any heroin/drug use. In event of overdose of heroin, use narcan kit and seek medical attention immediately. We made a referral to [facility], and see attached information for additional treatment options.   Return to ER if worse, new symptoms, worsening or severe pain, vomiting, numbness/weakness, or should you reconsider and wish to having imaging studies completed.
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
## 2020-10-07 ENCOUNTER — Telehealth: Payer: Self-pay

## 2020-10-07 NOTE — Telephone Encounter (Signed)
Copied from CRM [id number]. Topic: General - Other >> Oct 07, 2020 11:05 AM Leafy Ro wrote: Reason for CRM: PT mother Marylu Lund is calling and patient (her son)is getting ready to inherited a large sum of money and mom is concern due to her son is drug addict and would like  dr Sullivan Lone to return her call . Pt mom would like to get guardianship over his money due to his situation and would like md advice

## 2020-10-08 NOTE — Telephone Encounter (Signed)
Patient is inheriting a good bit of money from his grandfather.  He has had major problems with addiction and has checked himself out of 3 rehab clinics in the past year.  Mother would like him to be declared incompetent and he is in a addict but I think he is  competent to make his own decisions.

## 2020-10-09 NOTE — Telephone Encounter (Signed)
FYI-Janet is not on the patient's DPR.

## 2020-11-18 ENCOUNTER — Telehealth: Payer: Self-pay | Admitting: Family Medicine

## 2020-11-18 NOTE — Telephone Encounter (Signed)
Pts mother called on behalf of pt. She states that the pt is incarcerated and is having trouble sleeping. Pt is requesting to have Trazodone or something like to help him sleep. Pt is specifically asking for something at least 100mg . Please advise.        [facility] - [place], [place] - [address]  [address] [place]  Phone: [phone] Fax: [phone]  Hours: Not open 24 hours

## 2020-11-18 NOTE — Telephone Encounter (Signed)
Advised mother that this is not something that can be done.

## 2021-07-15 IMAGING — MR MR ELBOW*R* WO/W CM
10 series · 40 of 40 positions shown · IV contrast (7ml Gadavist)
Comparison: Radiographs 06/23/2019

CLINICAL DATA: Pain, swelling and redness in the elbow region.
History of IV drug abuse.

EXAM:
MRI OF THE RIGHT ELBOW WITHOUT AND WITH CONTRAST
TECHNIQUE: Multiplanar, multisequence MR imaging of the elbow was performed
before and after the administration of intravenous contrast.
CONTRAST:  7mL GADAVIST GADOBUTROL 1 MMOL/ML IV SOLN

[Series 4: T1 · axial · right · 3.0mm · 0.55mm/px · z∈[-29,+81]mm · 4 of 30 slices shown (1 of 2)]
[im 1/30]
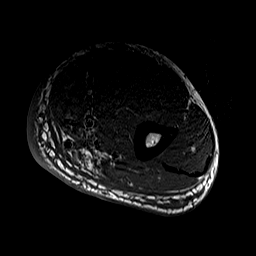
[im 10/30]
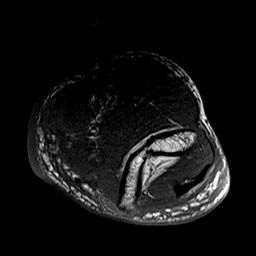
[im 20/30]
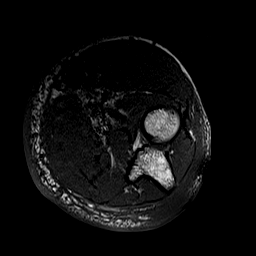
[im 30/30]
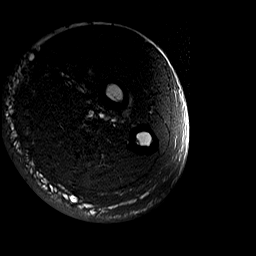

[Series 5: T2 fat-sat · axial · right · 3.0mm · 0.55mm/px · z∈[-29,+81]mm · 4 of 30 slices shown (1 of 2)]
[im 1/30]
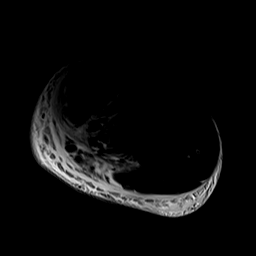
[im 10/30]
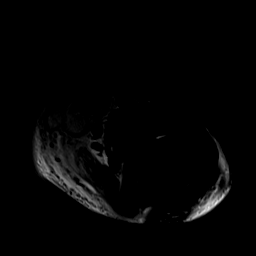
[im 20/30]
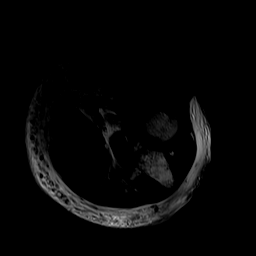
[im 30/30]
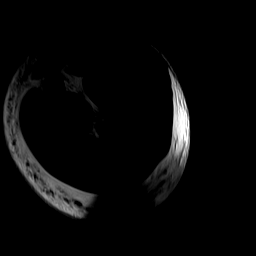

[Series 6: T1 fat-sat · axial · non-contrast · right · 3.0mm · 0.55mm/px · z∈[-29,+81]mm · 4 of 30 slices shown]
[im 1/30]
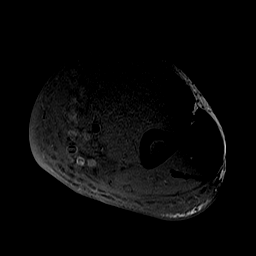
[im 10/30]
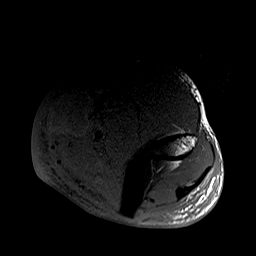
[im 20/30]
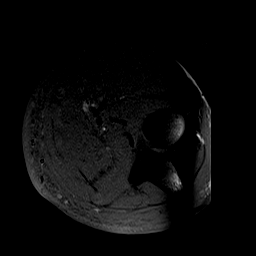
[im 30/30]
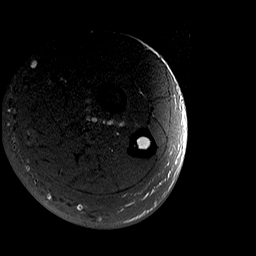

[Series 8: T1 · oblique · right · 3.0mm · 0.62mm/px · 4 of 33 slices shown (2 of 2)]
[im 1/33]
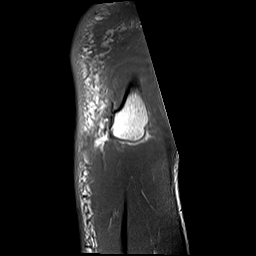
[im 11/33]
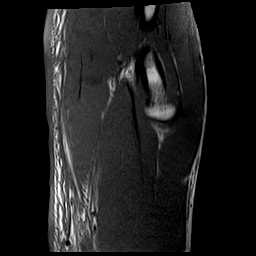
[im 22/33]
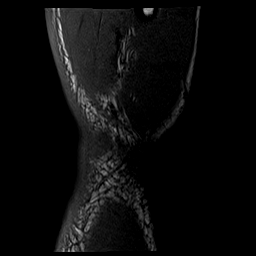
[im 33/33]
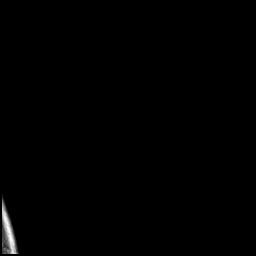

[Series 9: STIR · oblique · right · 3.0mm · 0.62mm/px · 4 of 33 slices shown]
[im 1/33]
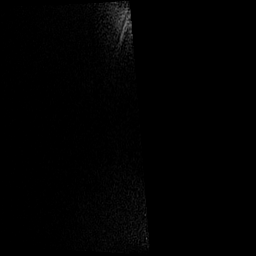
[im 11/33]
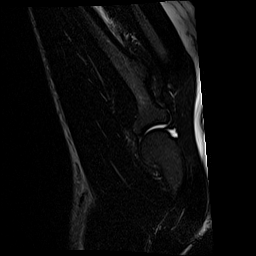
[im 22/33]
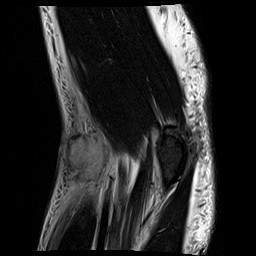
[im 33/33]
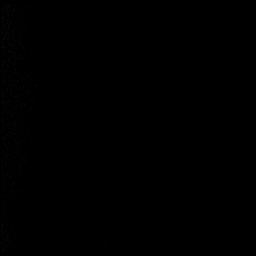

[Series 10: PD fat-sat · oblique · right · 3.0mm · 0.62mm/px · 4 of 33 slices shown]
[im 1/33]
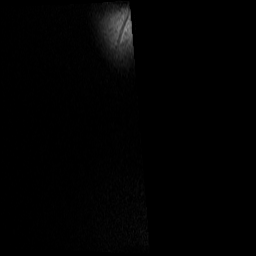
[im 11/33]
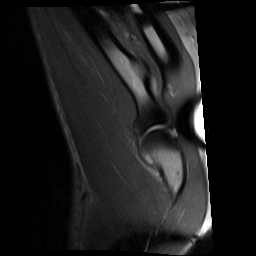
[im 22/33]
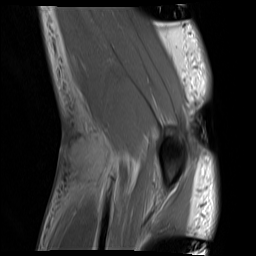
[im 33/33]
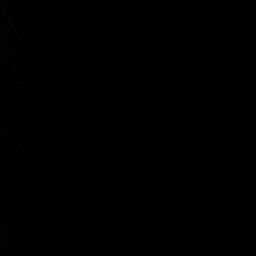

[Series 11: T2 fat-sat · oblique · right · 3.0mm · 0.62mm/px · 4 of 33 slices shown (2 of 2)]
[im 1/33]
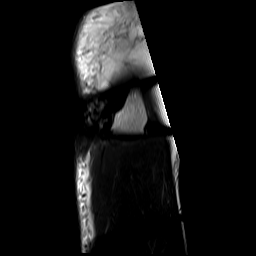
[im 11/33]
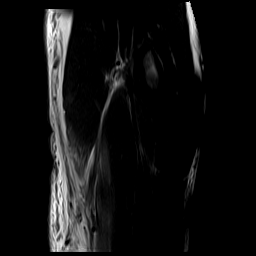
[im 22/33]
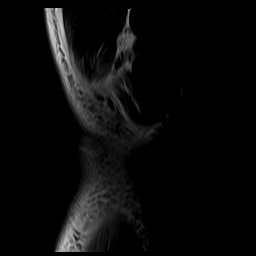
[im 33/33]
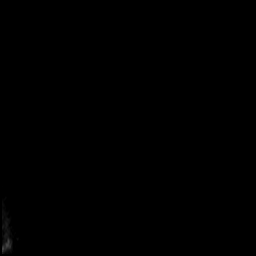

[Series 12: T1 fat-sat post-contrast · axial · right · 3.0mm · 0.55mm/px · z∈[-29,+81]mm · 4 of 30 slices shown (1 of 3)]
[im 1/30]
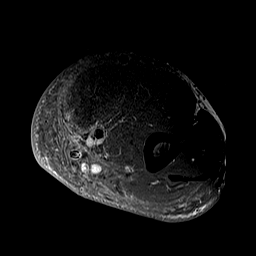
[im 10/30]
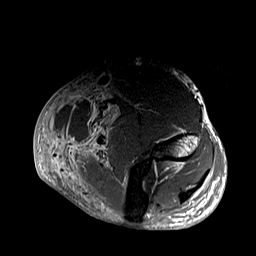
[im 20/30]
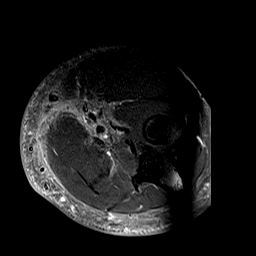
[im 30/30]
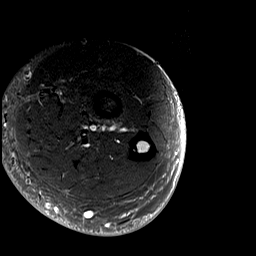

[Series 13: T1 fat-sat post-contrast · oblique · right · 3.0mm · 0.62mm/px · 4 of 33 slices shown (2 of 3)]
[im 1/33]
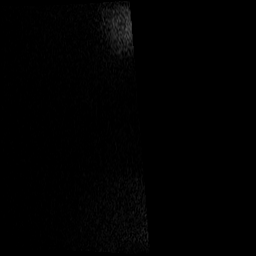
[im 11/33]
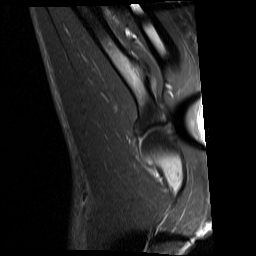
[im 22/33]
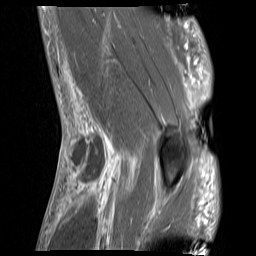
[im 33/33]
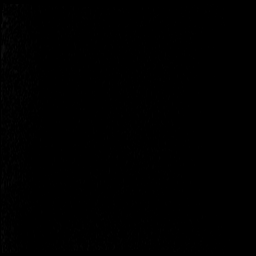

[Series 14: T1 fat-sat post-contrast · oblique · right · 3.0mm · 0.62mm/px · 4 of 33 slices shown (3 of 3)]
[im 1/33]
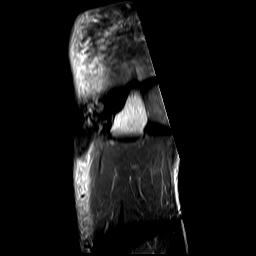
[im 11/33]
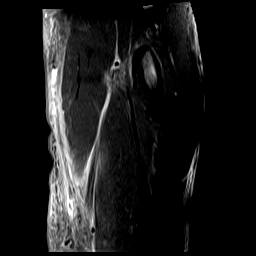
[im 22/33]
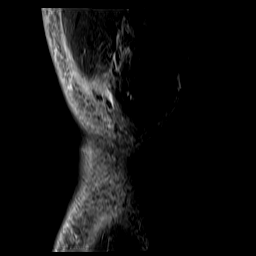
[im 33/33]
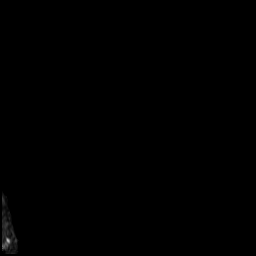

[40 of 40 positions shown; findings below may reference images not displayed]

FINDINGS: Extensive subcutaneous soft tissue swelling/edema/fluid consistent
with severe cellulitis. There is also a rim enhancing fluid
collection in the antecubital fossa measuring 3.0 x 2.2 x 1.9 cm
consistent with a focal subcutaneous abscess.

Mild adjacent myositis involving the pronator teres, brachialis and
brachioradialis muscles. No findings for pyomyositis.

No evidence of septic arthritis at the elbow joint and no findings
for osteomyelitis.

The radial and ulnar collateral ligaments are intact and the biceps,
brachialis and triceps tendons are intact.
IMPRESSION: 1. Diffuse cellulitis and 3.0 x 2.2 x 1.9 cm abscess in the
subcutaneous tissues of the antecubital fossa.
2. Mild adjacent myositis but no findings for pyomyositis.
3. No MR findings to suggest septic arthritis or osteomyelitis.

## 2021-09-28 ENCOUNTER — Other Ambulatory Visit: Payer: Self-pay

## 2021-09-28 ENCOUNTER — Encounter (HOSPITAL_COMMUNITY): Payer: Self-pay | Admitting: Emergency Medicine

## 2021-09-28 ENCOUNTER — Ambulatory Visit (HOSPITAL_COMMUNITY)
Admission: EM | Admit: 2021-09-28 | Discharge: 2021-09-28 | Disposition: A | Discharge status: Home / Self Care | Payer: MEDICAID | Attending: Family Medicine | Admitting: Family Medicine

## 2021-09-28 DIAGNOSIS — L03012 Cellulitis of left finger: Secondary | ICD-10-CM

## 2021-09-28 MED ORDER — MUPIROCIN 2 % EX OINT: 1.0000 "application " | TOPICAL_OINTMENT | Freq: Two times a day (BID) | CUTANEOUS | 0 refills | Status: DC

## 2021-09-28 MED ORDER — IBUPROFEN 800 MG PO TABS: 800.0000 mg | ORAL_TABLET | Freq: Three times a day (TID) | ORAL | 0 refills | Status: DC | PRN

## 2021-09-28 MED ORDER — AMOXICILLIN-POT CLAVULANATE 875-125 MG PO TABS: 1.0000 | ORAL_TABLET | Freq: Two times a day (BID) | ORAL | 0 refills | Status: AC

## 2021-09-28 NOTE — ED Provider Notes (Signed)
[facility]    CSN: [id number] Arrival date & time: 09/28/21  1109      History   Chief Complaint No chief complaint on file.   HPI Joel [name] is a 31 y.o. male.   HPI Here with swelling and redness in his left index finger that began 3 or 4 days ago.  It has now been draining purulent material from the tip.  About 2 weeks ago he was going through some woods and thought maybe he got a prior or thorn in his finger.  About 1 week ago he thought he saw the prior his finger was little tender so he tried to get out.  No fever or chills.    Past Medical History:  Diagnosis Date   Enlarged lymph node 2014   Polysubstance abuse ([facility])    Total bilirubin, elevated     Patient Active Problem List   Diagnosis Date Noted   [name] drug user    Abscess of arm, right    Cellulitis of right elbow 06/23/2019   Rash 07/30/2018   Prolonged QT interval 07/30/2018   Hyponatremia 07/30/2018   Nausea vomiting and diarrhea 07/30/2018   Hypokalemia 07/30/2018   Serum total bilirubin elevated 07/30/2018   Polysubstance abuse ([facility]) 07/29/2018   Enlarged lymph node     Past Surgical History:  Procedure Laterality Date   TONSILLECTOMY         Home Medications    Prior to Admission medications   Medication Sig Start Date End Date Taking? Authorizing Provider  amoxicillin-clavulanate (AUGMENTIN) 875-125 MG tablet Take 1 tablet by mouth 2 (two) times daily for 7 days. 09/28/21 10/05/21 Yes Vangie Henthorn, Janace Aris, MD  ibuprofen (ADVIL) 800 MG tablet Take 1 tablet (800 mg total) by mouth every 8 (eight) hours as needed (pain). 09/28/21  Yes Zenia Resides, MD  mupirocin ointment (BACTROBAN) 2 % Apply 1 application topically 2 (two) times daily. To affected area till better 09/28/21  Yes Jolette Lana, Janace Aris, MD    Family History History reviewed. No pertinent family history.  Social History Social History   Tobacco Use   Smoking status: Every Day    Packs/day: 1.00     Years: 6.00    Pack years: 6.00    Types: Cigarettes   Smokeless tobacco: Never  Vaping Use   Vaping Use: Every day  Substance Use Topics   Alcohol use: Not Currently   Drug use: Yes    Types: Cocaine    Comment: [name] heroin     Allergies   Poison ivy extract and Sertraline hcl   Review of Systems Review of Systems   Physical Exam Triage Vital Signs ED Triage Vitals  Enc Vitals Group     BP 09/28/21 1316 138/84     Pulse Rate 09/28/21 1316 94     Resp 09/28/21 1316 20     Temp 09/28/21 1316 98.7 F (37.1 C)     Temp Source 09/28/21 1316 Oral     SpO2 09/28/21 1316 99 %     Weight --      Height --      Head Circumference --      Peak Flow --      Pain Score 09/28/21 1313 5     Pain Loc --      Pain Edu? --      Excl. in GC? --    No data found.  Updated Vital Signs BP 138/84 (BP Location:  Right Arm)    Pulse 94    Temp 98.7 F (37.1 C) (Oral)    Resp 20    SpO2 99%   Visual Acuity Right Eye Distance:   Left Eye Distance:   Bilateral Distance:    Right Eye Near:   Left Eye Near:    Bilateral Near:     Physical Exam Vitals reviewed.  Constitutional:      General: He is not in acute distress.    Appearance: He is not toxic-appearing.  Musculoskeletal:     Comments: Left index finger is swollen about the distal phalanx, with some ?fluctuance on the finger pad. Purulent dc seen at the tip.  Neurological:     Mental Status: He is alert.     UC Treatments / Results  Labs (all labs ordered are listed, but only abnormal results are displayed) Labs Reviewed - No data to display  EKG   Radiology No results found.  Procedures Procedures (including critical care time)  Medications Ordered in UC Medications - No data to display  Initial Impression / Assessment and Plan / UC Course  I have reviewed the triage vital signs and the nursing notes.  Pertinent labs & imaging results that were available during my care of the patient were reviewed by me  and considered in my medical decision making (see chart for details).     Wound cleaned with Hibiclens and loose dressing applied.  Wound care explained.  We will treat with Augmentin orally and warm compresses or soaks.  Warnings given for if worsening in any way or if not improving in 48 hours, that he needs to proceed to the emergency room for possible drainage procedure Final Clinical Impressions(s) / UC Diagnoses   Final diagnoses:  Cellulitis of finger of left hand     Discharge Instructions      Take amoxicillin-clavulanate 875 mg 1 tab twice daily with food for 7 days. Take ibuprofen 800 mg 1 every 8 hours as needed for pain And apply mupirocin antibiotic ointment twice daily to the sore area until improved.  Do warm soaks on the finger and keep it elevated.  Wash the wound twice daily and then apply new antibiotic ointment. If not improving in 48 hours or if worse in any way, proceed to the emergency room     ED Prescriptions     Medication Sig Dispense Auth. Provider   amoxicillin-clavulanate (AUGMENTIN) 875-125 MG tablet Take 1 tablet by mouth 2 (two) times daily for 7 days. 14 tablet Anesa Fronek, Gwenlyn Perking, MD   mupirocin ointment (BACTROBAN) 2 % Apply 1 application topically 2 (two) times daily. To affected area till better 22 g Barrett Henle, MD   ibuprofen (ADVIL) 800 MG tablet Take 1 tablet (800 mg total) by mouth every 8 (eight) hours as needed (pain). 21 tablet Rayen Palen, Gwenlyn Perking, MD      PDMP not reviewed this encounter.   Barrett Henle, MD 09/28/21 1331

## 2021-09-28 NOTE — ED Triage Notes (Signed)
Pt called from front lobby with no answer 

## 2021-09-28 NOTE — ED Triage Notes (Signed)
One week ago patient started digging and poking at left index finger, thinking he had a briar in finger.  Approximately 2 weeks ago was running through woods and thought finger injured with briar.  Left index , finger tip is swollen, red , and has an open wound.  Patient has neosporin on wound.  Patient continues to believe there is a briar in finger tip

## 2021-09-28 NOTE — ED Notes (Signed)
Called patient's phone number

## 2021-09-28 NOTE — Discharge Instructions (Addendum)
Take amoxicillin-clavulanate 875 mg 1 tab twice daily with food for 7 days. Take ibuprofen 800 mg 1 every 8 hours as needed for pain And apply mupirocin antibiotic ointment twice daily to the sore area until improved.  Do warm soaks on the finger and keep it elevated.  Wash the wound twice daily and then apply new antibiotic ointment. If not improving in 48 hours or if worse in any way, proceed to the emergency room

## 2022-01-09 ENCOUNTER — Ambulatory Visit (HOSPITAL_COMMUNITY)
Admission: EM | Admit: 2022-01-09 | Discharge: 2022-01-09 | Disposition: A | Discharge status: Home / Self Care | Payer: Self-pay | Attending: Physician Assistant | Admitting: Physician Assistant

## 2022-01-09 ENCOUNTER — Encounter (HOSPITAL_COMMUNITY): Payer: Self-pay | Admitting: *Deleted

## 2022-01-09 DIAGNOSIS — L02413 Cutaneous abscess of right upper limb: Secondary | ICD-10-CM

## 2022-01-09 MED ORDER — DOXYCYCLINE HYCLATE 100 MG PO CAPS: 100.0000 mg | ORAL_CAPSULE | Freq: Two times a day (BID) | ORAL | 0 refills | Status: AC

## 2022-01-09 MED ORDER — LIDOCAINE HCL (PF) 1 % IJ SOLN
INTRAMUSCULAR | Status: AC
  Filled 2022-01-09: qty 2

## 2022-01-09 MED ORDER — CEFTRIAXONE SODIUM 1 G IJ SOLR
INTRAMUSCULAR | Status: AC
  Filled 2022-01-09: qty 10

## 2022-01-09 MED ORDER — CEFTRIAXONE SODIUM 1 G IJ SOLR
1.0000 g | Freq: Once | INTRAMUSCULAR | Status: AC
  Administered 2022-01-09: 1 g via INTRAMUSCULAR

## 2022-01-09 MED ORDER — AMOXICILLIN-POT CLAVULANATE 875-125 MG PO TABS: 1.0000 | ORAL_TABLET | Freq: Two times a day (BID) | ORAL | 0 refills | Status: AC

## 2022-01-09 NOTE — ED Provider Notes (Signed)
[place]    CSN: [id number] Arrival date & time: 01/09/22  1751      History   Chief Complaint Chief Complaint  Patient presents with   Animal Bite    HPI Joel [name] is a 31 y.o. male.   Patient presents today with a 2-day history of enlarging draining lesion on his right forearm.  He reports that he has a new puppy and that he has been doubling on him and he is unsure if this is the cause of initial wound.  He also was wearing a bracelet that had caused a small wound to this area.  He is unsure about the puppies vaccine status but is not open to going to the emergency room.  He does report that he is an [name] drug user but this is not an injection site.  He denies any recent antibiotics.  Denies any fever, nausea, vomiting, dizziness, body aches.  He denies history of recurrent skin infections or MRSA.  He has not tried any over-the-counter medication for symptom management.   Past Medical History:  Diagnosis Date   Enlarged lymph node 2014   Polysubstance abuse ([place])    Total bilirubin, elevated     Patient Active Problem List   Diagnosis Date Noted   [name] drug user    Abscess of arm, right    Cellulitis of right elbow 06/23/2019   Rash 07/30/2018   Prolonged QT interval 07/30/2018   Hyponatremia 07/30/2018   Nausea vomiting and diarrhea 07/30/2018   Hypokalemia 07/30/2018   Serum total bilirubin elevated 07/30/2018   Polysubstance abuse ([place]) 07/29/2018   Enlarged lymph node     Past Surgical History:  Procedure Laterality Date   TONSILLECTOMY         Home Medications    Prior to Admission medications   Medication Sig Start Date End Date Taking? Authorizing Provider  amoxicillin-clavulanate (AUGMENTIN) 875-125 MG tablet Take 1 tablet by mouth every 12 (twelve) hours. 01/09/22  Yes Mone Commisso K, PA-C  doxycycline (VIBRAMYCIN) 100 MG capsule Take 1 capsule (100 mg total) by mouth 2 (two) times daily. 01/09/22  Yes Loys Hoselton, Derry Skill, PA-C     Family History Family History  Problem Relation Age of Onset   Healthy Mother     Social History Social History   Tobacco Use   Smoking status: Every Day    Packs/day: 1.00    Years: 6.00    Pack years: 6.00    Types: Cigarettes   Smokeless tobacco: Never  Vaping Use   Vaping Use: Every day  Substance Use Topics   Alcohol use: Not Currently   Drug use: Yes    Types: Cocaine    Comment: [name] heroin     Allergies   Poison ivy extract and Sertraline hcl   Review of Systems Review of Systems  Constitutional:  Positive for activity change. Negative for appetite change, fatigue and fever.  Gastrointestinal:  Negative for abdominal pain, diarrhea, nausea and vomiting.  Musculoskeletal:  Negative for arthralgias and myalgias.  Skin:  Positive for color change and wound.  Neurological:  Negative for dizziness, weakness, light-headedness, numbness and headaches.    Physical Exam Triage Vital Signs ED Triage Vitals  Enc Vitals Group     BP 01/09/22 1808 115/74     Pulse Rate 01/09/22 1808 76     Resp 01/09/22 1808 16     Temp 01/09/22 1808 97.9 F (36.6 C)     Temp  Source 01/09/22 1808 Oral     SpO2 01/09/22 1808 93 %     Weight --      Height --      Head Circumference --      Peak Flow --      Pain Score 01/09/22 1809 5     Pain Loc --      Pain Edu? --      Excl. in [place]? --    No data found.  Updated Vital Signs BP 115/74   Pulse 76   Temp 97.9 F (36.6 C) (Oral)   Resp 16   SpO2 93%   Visual Acuity Right Eye Distance:   Left Eye Distance:   Bilateral Distance:    Right Eye Near:   Left Eye Near:    Bilateral Near:     Physical Exam Vitals reviewed.  Constitutional:      General: He is awake.     Appearance: Normal appearance. He is well-developed. He is not ill-appearing.     Comments: Very pleasant male appears stated age in no acute distress   HENT:     Head: Normocephalic and atraumatic.  Cardiovascular:     Rate and Rhythm: Normal  rate and regular rhythm.     Heart sounds: Normal heart sounds, S1 normal and S2 normal. No murmur heard. Pulmonary:     Effort: Pulmonary effort is normal.     Breath sounds: Normal breath sounds. No stridor. No wheezing, rhonchi or rales.     Comments: Clear to auscultation bilaterally Abdominal:     General: Bowel sounds are normal.     Palpations: Abdomen is soft.     Tenderness: There is no abdominal tenderness.  Skin:    Findings: Abscess and wound present.     Comments: 3 cm x 3 cm indurated nodule with surrounding erythema noted dorsal right arm.  No significant fluctuance.  No active bleeding.  Small amount of purulent drainage expressed with palpation.  Neurological:     Mental Status: He is alert.  Psychiatric:        Behavior: Behavior is cooperative.       UC Treatments / Results  Labs (all labs ordered are listed, but only abnormal results are displayed) Labs Reviewed  AEROBIC CULTURE W GRAM STAIN (SUPERFICIAL SPECIMEN)    EKG   Radiology No results found.  Procedures Procedures (including critical care time)  Medications Ordered in UC Medications  cefTRIAXone (ROCEPHIN) injection 1 g (1 g Intramuscular Given 01/09/22 1834)    Initial Impression / Assessment and Plan / UC Course  I have reviewed the triage vital signs and the nursing notes.  Pertinent labs & imaging results that were available during my care of the patient were reviewed by me and considered in my medical decision making (see chart for details).     Discussed with the patient has concern for animal bite with unknown vaccine status CBC to do would be to go to the emergency room for rabies vaccine however patient declined this despite discussing significant risks.  Also discussed with and utility of going to the emergency room given severity of infection for further work-up but patient declined this today.  Concern for infection and requested x-ray to ensure no osteomyelitis as well as  basic labs including CBC/CMP but patient declined this as he is without insurance and is not present in work-up.  He was agreeable to attempting to obtain a culture.  Superficial wound culture sent to lab  and we will contact you if we need to change antibiotics based on susceptibilities.  He was given 1 g of Rocephin in clinic and started on Augmentin as well as doxycycline.  Discussed that he needs to have someone follow-up and monitor this wound within a few days and if he cannot see primary care he is to come see [place] for reevaluation.  Can use over-the-counter medication for symptom management.  Discussed that if he has any worsening symptoms including spreading redness despite antibiotics, fever, nausea, vomiting, body aches he needs to go to the emergency room immediately to which he expressed understanding.  Final Clinical Impressions(s) / UC Diagnoses   Final diagnoses:  Abscess of skin of right wrist     Discharge Instructions      I am concerned about a severe infection.  We gave you an injection of antibiotics and I want you to start Augmentin and doxycycline outpatient.  Stay out of the sun while on these medications.  Keep area clean with soap and water.  Someone should recheck this within a few days.  If you cannot see your primary care please follow-up here.  If anything worsens and the redness spreads, you develop fever, nausea/vomiting interfering with oral intake, weakness you need to go to the emergency room immediately.  We will contact you with your culture result if we need to change antibiotics.     ED Prescriptions     Medication Sig Dispense Auth. Provider   doxycycline (VIBRAMYCIN) 100 MG capsule Take 1 capsule (100 mg total) by mouth 2 (two) times daily. 20 capsule Genesee Nase K, PA-C   amoxicillin-clavulanate (AUGMENTIN) 875-125 MG tablet Take 1 tablet by mouth every 12 (twelve) hours. 20 tablet Lyn Joens, Derry Skill, PA-C      PDMP not reviewed this encounter.    Veleta Miners 01/09/22 1904

## 2022-01-09 NOTE — ED Triage Notes (Signed)
Pt states his puppy has been nibbling and playing/chewing on him; reports break in skin last week from puppy. Over last 2 days started with significant wound to right posterior wrist. Large area of swelling, redness, abscess noted. Denies any known fevers. 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 States he "has to check", but doesn't believe puppy is UTD on immunizations yet. Discussed we strongly recommend he goes to ED to start rabies series; discussed risk of death in not doing so; pt states he verbalizes understanding, but is declining to do so.

## 2022-01-09 NOTE — Discharge Instructions (Signed)
I am concerned about a severe infection.  We gave you an injection of antibiotics and I want you to start Augmentin and doxycycline outpatient.  Stay out of the sun while on these medications.  Keep area clean with soap and water.  Someone should recheck this within a few days.  If you cannot see your primary care please follow-up here.  If anything worsens and the redness spreads, you develop fever, nausea/vomiting interfering with oral intake, weakness you need to go to the emergency room immediately.  We will contact you with your culture result if we need to change antibiotics.

## 2022-01-12 LAB — AEROBIC CULTURE W GRAM STAIN (SUPERFICIAL SPECIMEN): Gram Stain: NONE SEEN
